# Patient Record
Sex: Female | Born: 1985 | Race: Black or African American | Hispanic: No | Marital: Single | Smoking: Current every day smoker
Health system: Southern US, Community
[De-identification: ages and names within clinical notes are randomized; demographics above are authoritative.]

## PROBLEM LIST (undated history)

## (undated) ENCOUNTER — Inpatient Hospital Stay: Payer: Self-pay

## (undated) DIAGNOSIS — F329 Major depressive disorder, single episode, unspecified: Secondary | ICD-10-CM

## (undated) DIAGNOSIS — F32A Depression, unspecified: Secondary | ICD-10-CM

## (undated) HISTORY — PX: NO PAST SURGERIES: SHX2092

---
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
## 2004-07-07 ENCOUNTER — Inpatient Hospital Stay: Payer: Self-pay

## 2004-08-25 ENCOUNTER — Emergency Department: Payer: Self-pay | Admitting: Unknown Physician Specialty

## 2005-07-29 ENCOUNTER — Emergency Department: Payer: Self-pay | Admitting: Emergency Medicine

## 2006-01-28 ENCOUNTER — Other Ambulatory Visit: Payer: Self-pay

## 2006-01-28 ENCOUNTER — Emergency Department: Payer: Self-pay | Admitting: Emergency Medicine

## 2006-07-12 ENCOUNTER — Emergency Department: Payer: Self-pay | Admitting: General Practice

## 2006-07-12 ENCOUNTER — Other Ambulatory Visit: Payer: Self-pay

## 2006-11-26 ENCOUNTER — Emergency Department: Payer: Self-pay | Admitting: Emergency Medicine

## 2007-07-16 ENCOUNTER — Emergency Department: Payer: Self-pay | Admitting: Emergency Medicine

## 2007-09-29 ENCOUNTER — Emergency Department: Payer: Self-pay | Admitting: Emergency Medicine

## 2008-04-01 ENCOUNTER — Emergency Department: Payer: Self-pay | Admitting: Emergency Medicine

## 2008-08-04 ENCOUNTER — Emergency Department: Payer: Self-pay | Admitting: Emergency Medicine

## 2008-11-07 ENCOUNTER — Emergency Department: Payer: Self-pay | Admitting: Emergency Medicine

## 2008-12-03 ENCOUNTER — Emergency Department: Payer: Self-pay | Admitting: Emergency Medicine

## 2008-12-12 ENCOUNTER — Encounter: Payer: Self-pay | Admitting: Maternal & Fetal Medicine

## 2009-01-02 ENCOUNTER — Encounter: Payer: Self-pay | Admitting: Obstetrics and Gynecology

## 2009-01-09 ENCOUNTER — Emergency Department: Payer: Self-pay | Admitting: Unknown Physician Specialty

## 2009-02-03 ENCOUNTER — Encounter: Payer: Self-pay | Admitting: Maternal & Fetal Medicine

## 2009-03-03 ENCOUNTER — Encounter: Payer: Self-pay | Admitting: Maternal and Fetal Medicine

## 2009-03-24 ENCOUNTER — Encounter: Payer: Self-pay | Admitting: Obstetrics and Gynecology

## 2009-03-24 ENCOUNTER — Observation Stay: Payer: Self-pay

## 2009-03-25 ENCOUNTER — Ambulatory Visit: Payer: Self-pay | Admitting: Unknown Physician Specialty

## 2009-04-07 ENCOUNTER — Encounter: Payer: Self-pay | Admitting: Obstetrics and Gynecology

## 2009-04-24 ENCOUNTER — Encounter: Payer: Self-pay | Admitting: Obstetrics and Gynecology

## 2009-05-19 ENCOUNTER — Encounter: Payer: Self-pay | Admitting: Maternal & Fetal Medicine

## 2009-05-28 ENCOUNTER — Encounter: Payer: Self-pay | Admitting: Maternal & Fetal Medicine

## 2009-06-07 ENCOUNTER — Observation Stay: Payer: Self-pay

## 2009-06-14 ENCOUNTER — Observation Stay: Payer: Self-pay | Admitting: Obstetrics & Gynecology

## 2009-06-14 ENCOUNTER — Observation Stay: Payer: Self-pay

## 2009-06-21 ENCOUNTER — Observation Stay: Payer: Self-pay

## 2009-06-23 ENCOUNTER — Inpatient Hospital Stay: Payer: Self-pay

## 2009-08-21 ENCOUNTER — Emergency Department: Payer: Self-pay | Admitting: Emergency Medicine

## 2009-08-31 ENCOUNTER — Emergency Department: Payer: Self-pay | Admitting: Emergency Medicine

## 2009-09-07 ENCOUNTER — Emergency Department: Payer: Self-pay | Admitting: Unknown Physician Specialty

## 2009-10-01 ENCOUNTER — Emergency Department: Payer: Self-pay | Admitting: Emergency Medicine

## 2010-03-12 ENCOUNTER — Emergency Department: Payer: Self-pay | Admitting: Emergency Medicine

## 2010-07-01 ENCOUNTER — Emergency Department: Payer: Self-pay | Admitting: Emergency Medicine

## 2011-05-13 ENCOUNTER — Emergency Department: Payer: Self-pay | Admitting: Emergency Medicine

## 2011-09-10 ENCOUNTER — Emergency Department: Payer: Self-pay | Admitting: Unknown Physician Specialty

## 2011-10-13 ENCOUNTER — Emergency Department: Payer: Self-pay

## 2011-11-28 ENCOUNTER — Emergency Department: Payer: Self-pay | Admitting: Emergency Medicine

## 2011-11-28 LAB — COMPREHENSIVE METABOLIC PANEL
Albumin: 3.9 g/dL (ref 3.4–5.0)
Anion Gap: 11 (ref 7–16)
BUN: 20 mg/dL — ABNORMAL HIGH (ref 7–18)
Calcium, Total: 8.8 mg/dL (ref 8.5–10.1)
Chloride: 105 mmol/L (ref 98–107)
Co2: 27 mmol/L (ref 21–32)
EGFR (African American): >60
EGFR (Non-African Amer.): >60
Potassium: 3.9 mmol/L (ref 3.5–5.1)
SGOT(AST): 18 U/L (ref 15–37)
SGPT (ALT): 23 U/L

## 2011-11-28 LAB — URINALYSIS, COMPLETE
Ketone: NEGATIVE
Leukocyte Esterase: NEGATIVE
Nitrite: NEGATIVE
Ph: 7 (ref 4.5–8.0)
Protein: NEGATIVE
RBC,UR: 1 /HPF (ref 0–5)
Specific Gravity: 1.021 (ref 1.003–1.030)
WBC UR: 1 /HPF (ref 0–5)

## 2011-11-28 LAB — CBC
HCT: 40 % (ref 35.0–47.0)
HGB: 13.3 g/dL (ref 12.0–16.0)
MCH: 32.4 pg (ref 26.0–34.0)
MCV: 98 fL (ref 80–100)
Platelet: 310 10*3/uL (ref 150–440)
RBC: 4.1 10*6/uL (ref 3.80–5.20)
RDW: 13 % (ref 11.5–14.5)

## 2011-11-28 LAB — TROPONIN I: Troponin-I: <0.02 ng/mL

## 2011-12-16 ENCOUNTER — Emergency Department: Payer: Self-pay | Admitting: Emergency Medicine

## 2011-12-23 ENCOUNTER — Emergency Department: Payer: Self-pay | Admitting: Emergency Medicine

## 2011-12-23 LAB — CBC WITH DIFFERENTIAL/PLATELET
Basophil #: 0 10*3/uL (ref 0.0–0.1)
Basophil %: 0.4 %
HGB: 14.9 g/dL (ref 12.0–16.0)
MCH: 32.7 pg (ref 26.0–34.0)
MCHC: 33.7 g/dL (ref 32.0–36.0)
Monocyte #: 0.3 10*3/uL (ref 0.0–0.7)
Monocyte %: 7.4 %
Neutrophil #: 2.1 10*3/uL (ref 1.4–6.5)
Platelet: 311 10*3/uL (ref 150–440)
RBC: 4.56 10*6/uL (ref 3.80–5.20)
WBC: 4.5 10*3/uL (ref 3.6–11.0)

## 2011-12-23 LAB — COMPREHENSIVE METABOLIC PANEL
Albumin: 4.3 g/dL (ref 3.4–5.0)
Alkaline Phosphatase: 74 U/L (ref 50–136)
BUN: 12 mg/dL (ref 7–18)
Bilirubin,Total: 0.7 mg/dL (ref 0.2–1.0)
Calcium, Total: 8.7 mg/dL (ref 8.5–10.1)
Chloride: 108 mmol/L — ABNORMAL HIGH (ref 98–107)
Creatinine: 0.28 mg/dL — ABNORMAL LOW (ref 0.60–1.30)
EGFR (Non-African Amer.): >60
Glucose: 73 mg/dL (ref 65–99)
Osmolality: 278 (ref 275–301)
Potassium: 5.4 mmol/L — ABNORMAL HIGH (ref 3.5–5.1)
SGOT(AST): 53 U/L — ABNORMAL HIGH (ref 15–37)
SGPT (ALT): 25 U/L
Sodium: 140 mmol/L (ref 136–145)
Total Protein: 8.7 g/dL — ABNORMAL HIGH (ref 6.4–8.2)

## 2012-01-04 ENCOUNTER — Emergency Department: Payer: Self-pay | Admitting: *Deleted

## 2012-01-04 LAB — URINALYSIS, COMPLETE
Bacteria: NONE SEEN
Ketone: NEGATIVE
Nitrite: NEGATIVE
Ph: 7 (ref 4.5–8.0)
RBC,UR: 1 /HPF (ref 0–5)
Specific Gravity: 1.014 (ref 1.003–1.030)
Squamous Epithelial: 4

## 2012-02-03 ENCOUNTER — Emergency Department: Payer: Self-pay | Admitting: *Deleted

## 2012-02-03 LAB — URINALYSIS, COMPLETE
Bilirubin,UR: NEGATIVE
Blood: NEGATIVE
Glucose,UR: NEGATIVE mg/dL (ref 0–75)
Leukocyte Esterase: NEGATIVE
Ph: 6 (ref 4.5–8.0)
RBC,UR: <1 /HPF (ref 0–5)
Specific Gravity: 1.023 (ref 1.003–1.030)
Squamous Epithelial: 7

## 2012-02-03 LAB — HCG, QUANTITATIVE, PREGNANCY: Beta Hcg, Quant.: 142112 m[IU]/mL — ABNORMAL HIGH

## 2012-02-10 ENCOUNTER — Encounter: Payer: Self-pay | Admitting: Obstetrics and Gynecology

## 2012-03-02 ENCOUNTER — Encounter: Payer: Self-pay | Admitting: Obstetrics & Gynecology

## 2012-03-09 ENCOUNTER — Emergency Department: Payer: Self-pay | Admitting: Unknown Physician Specialty

## 2012-03-12 LAB — BETA STREP CULTURE(ARMC)

## 2012-03-20 ENCOUNTER — Emergency Department: Payer: Self-pay | Admitting: *Deleted

## 2012-03-21 LAB — URINALYSIS, COMPLETE
Glucose,UR: NEGATIVE mg/dL (ref 0–75)
Leukocyte Esterase: NEGATIVE
Nitrite: NEGATIVE
Protein: NEGATIVE
RBC,UR: 4 /HPF (ref 0–5)
Squamous Epithelial: 22
WBC UR: 11 /HPF (ref 0–5)

## 2012-03-23 ENCOUNTER — Encounter: Payer: Self-pay | Admitting: Obstetrics and Gynecology

## 2012-04-06 ENCOUNTER — Encounter: Payer: Self-pay | Admitting: Maternal and Fetal Medicine

## 2012-04-06 ENCOUNTER — Emergency Department: Payer: Self-pay | Admitting: Emergency Medicine

## 2012-04-06 LAB — URINALYSIS, COMPLETE
Blood: NEGATIVE
Glucose,UR: NEGATIVE mg/dL (ref 0–75)
Ketone: NEGATIVE
Nitrite: NEGATIVE
Protein: NEGATIVE
Specific Gravity: 1.02 (ref 1.003–1.030)
WBC UR: 8 /HPF (ref 0–5)

## 2012-04-18 ENCOUNTER — Emergency Department: Payer: Self-pay | Admitting: *Deleted

## 2012-04-20 ENCOUNTER — Encounter: Payer: Self-pay | Admitting: Obstetrics & Gynecology

## 2012-05-02 ENCOUNTER — Observation Stay: Payer: Self-pay | Admitting: Obstetrics and Gynecology

## 2012-05-02 LAB — URINALYSIS, COMPLETE
Bilirubin,UR: NEGATIVE
Nitrite: NEGATIVE
Protein: NEGATIVE
RBC,UR: 1 /HPF (ref 0–5)
Specific Gravity: 1.012 (ref 1.003–1.030)
WBC UR: 4 /HPF (ref 0–5)

## 2012-05-02 LAB — DRUG SCREEN, URINE
Barbiturates, Ur Screen: NEGATIVE (ref ?–200)
Benzodiazepine, Ur Scrn: NEGATIVE (ref ?–200)
Cannabinoid 50 Ng, Ur ~~LOC~~: NEGATIVE (ref ?–50)
MDMA (Ecstasy)Ur Screen: NEGATIVE (ref ?–500)
Methadone, Ur Screen: NEGATIVE (ref ?–300)
Phencyclidine (PCP) Ur S: NEGATIVE (ref ?–25)

## 2012-05-04 ENCOUNTER — Encounter: Payer: Self-pay | Admitting: Obstetrics and Gynecology

## 2012-05-15 ENCOUNTER — Emergency Department: Payer: Self-pay | Admitting: Emergency Medicine

## 2012-05-15 LAB — COMPREHENSIVE METABOLIC PANEL
Anion Gap: 7 (ref 7–16)
Bilirubin,Total: 0.3 mg/dL (ref 0.2–1.0)
Calcium, Total: 8.7 mg/dL (ref 8.5–10.1)
Co2: 26 mmol/L (ref 21–32)
Creatinine: 0.53 mg/dL — ABNORMAL LOW (ref 0.60–1.30)
EGFR (African American): >60
EGFR (Non-African Amer.): >60
Glucose: 70 mg/dL (ref 65–99)
Osmolality: 270 (ref 275–301)
Potassium: 3.4 mmol/L — ABNORMAL LOW (ref 3.5–5.1)

## 2012-05-15 LAB — CBC
HCT: 33 % — ABNORMAL LOW (ref 35.0–47.0)
HGB: 11.7 g/dL — ABNORMAL LOW (ref 12.0–16.0)
MCH: 33.9 pg (ref 26.0–34.0)
MCHC: 35.4 g/dL (ref 32.0–36.0)
Platelet: 308 10*3/uL (ref 150–440)
RDW: 14 % (ref 11.5–14.5)
WBC: 7.8 10*3/uL (ref 3.6–11.0)

## 2012-05-15 LAB — URINALYSIS, COMPLETE
Bilirubin,UR: NEGATIVE
Blood: NEGATIVE
Ketone: NEGATIVE
Leukocyte Esterase: NEGATIVE
Ph: 7 (ref 4.5–8.0)
RBC,UR: NONE SEEN /HPF (ref 0–5)
Specific Gravity: 1.015 (ref 1.003–1.030)

## 2012-05-16 ENCOUNTER — Observation Stay: Payer: Self-pay

## 2012-05-16 LAB — RAPID INFLUENZA A&B ANTIGENS

## 2012-05-18 ENCOUNTER — Encounter: Payer: Self-pay | Admitting: Maternal & Fetal Medicine

## 2012-06-01 ENCOUNTER — Encounter: Payer: Self-pay | Admitting: Obstetrics and Gynecology

## 2012-06-03 ENCOUNTER — Observation Stay: Payer: Self-pay | Admitting: Obstetrics & Gynecology

## 2012-06-03 LAB — URINALYSIS, COMPLETE
Bilirubin,UR: NEGATIVE
Blood: NEGATIVE
Nitrite: NEGATIVE
Ph: 7 (ref 4.5–8.0)
RBC,UR: 1 /HPF (ref 0–5)
Squamous Epithelial: 11

## 2012-06-03 LAB — WET PREP, GENITAL

## 2012-06-03 LAB — FETAL FIBRONECTIN: Fetal Fibronectin: NEGATIVE

## 2012-06-16 ENCOUNTER — Observation Stay: Payer: Self-pay | Admitting: Obstetrics and Gynecology

## 2012-06-16 LAB — URINALYSIS, COMPLETE
Bilirubin,UR: NEGATIVE
Blood: NEGATIVE
Nitrite: NEGATIVE
Ph: 7 (ref 4.5–8.0)
Protein: NEGATIVE
Specific Gravity: 1.006 (ref 1.003–1.030)
Squamous Epithelial: 49
WBC UR: 7 /HPF (ref 0–5)

## 2012-06-17 LAB — URINE CULTURE

## 2012-06-22 ENCOUNTER — Observation Stay: Payer: Self-pay | Admitting: Obstetrics and Gynecology

## 2012-06-22 LAB — URINALYSIS, COMPLETE
Bacteria: NONE SEEN
Bilirubin,UR: NEGATIVE
Blood: NEGATIVE
Glucose,UR: NEGATIVE mg/dL (ref 0–75)
Ketone: NEGATIVE
Nitrite: NEGATIVE
RBC,UR: <1 /HPF (ref 0–5)
Specific Gravity: 1.01 (ref 1.003–1.030)
Squamous Epithelial: 10
WBC UR: 1 /HPF (ref 0–5)

## 2012-07-07 ENCOUNTER — Observation Stay: Payer: Self-pay | Admitting: Obstetrics & Gynecology

## 2012-07-07 LAB — URINALYSIS, COMPLETE
Bilirubin,UR: NEGATIVE
Blood: NEGATIVE
Ketone: NEGATIVE
Protein: NEGATIVE
RBC,UR: <1 /HPF (ref 0–5)
Specific Gravity: 1.006 (ref 1.003–1.030)
WBC UR: 1 /HPF (ref 0–5)

## 2012-07-20 ENCOUNTER — Observation Stay: Payer: Self-pay | Admitting: Obstetrics and Gynecology

## 2012-07-20 LAB — CBC WITH DIFFERENTIAL/PLATELET
Eosinophil #: 0.2 10*3/uL (ref 0.0–0.7)
Eosinophil %: 2.4 %
HCT: 32.5 % — ABNORMAL LOW (ref 35.0–47.0)
Lymphocyte #: 2.1 10*3/uL (ref 1.0–3.6)
Lymphocyte %: 25.8 %
MCV: 96 fL (ref 80–100)
Monocyte #: 1 x10 3/mm — ABNORMAL HIGH (ref 0.2–0.9)
Monocyte %: 12.4 %
Neutrophil %: 59.1 %
Platelet: 315 10*3/uL (ref 150–440)
RBC: 3.39 10*6/uL — ABNORMAL LOW (ref 3.80–5.20)
RDW: 13.4 % (ref 11.5–14.5)
WBC: 8.2 10*3/uL (ref 3.6–11.0)

## 2012-07-25 ENCOUNTER — Observation Stay: Payer: Self-pay | Admitting: Obstetrics and Gynecology

## 2012-07-26 ENCOUNTER — Observation Stay: Payer: Self-pay | Admitting: Obstetrics and Gynecology

## 2012-07-26 LAB — CBC WITH DIFFERENTIAL/PLATELET
Basophil %: 1 %
Eosinophil %: 2.4 %
HCT: 31.8 % — ABNORMAL LOW (ref 35.0–47.0)
Lymphocyte #: 2 10*3/uL (ref 1.0–3.6)
Lymphocyte %: 28.4 %
MCH: 32.5 pg (ref 26.0–34.0)
Neutrophil #: 3.9 10*3/uL (ref 1.4–6.5)
Neutrophil %: 57 %
Platelet: 275 10*3/uL (ref 150–440)
RBC: 3.34 10*6/uL — ABNORMAL LOW (ref 3.80–5.20)
WBC: 6.9 10*3/uL (ref 3.6–11.0)

## 2012-07-26 LAB — DRUG SCREEN, URINE
Cannabinoid 50 Ng, Ur ~~LOC~~: POSITIVE (ref ?–50)
Cocaine Metabolite,Ur ~~LOC~~: NEGATIVE (ref ?–300)
MDMA (Ecstasy)Ur Screen: NEGATIVE (ref ?–500)
Methadone, Ur Screen: NEGATIVE (ref ?–300)
Phencyclidine (PCP) Ur S: NEGATIVE (ref ?–25)
Tricyclic, Ur Screen: NEGATIVE (ref ?–1000)

## 2012-07-26 LAB — URINALYSIS, COMPLETE
Bilirubin,UR: NEGATIVE
Glucose,UR: NEGATIVE mg/dL (ref 0–75)
Nitrite: NEGATIVE
RBC,UR: 1 /HPF (ref 0–5)
Squamous Epithelial: 13
WBC UR: 5 /HPF (ref 0–5)

## 2012-07-31 ENCOUNTER — Observation Stay: Payer: Self-pay

## 2012-08-13 ENCOUNTER — Inpatient Hospital Stay: Payer: Self-pay

## 2012-08-13 LAB — URINALYSIS, COMPLETE
Bilirubin,UR: NEGATIVE
Ketone: NEGATIVE
Leukocyte Esterase: NEGATIVE
Nitrite: NEGATIVE
Ph: 6 (ref 4.5–8.0)
Protein: 30
RBC,UR: 384 /HPF (ref 0–5)
Specific Gravity: 1.02 (ref 1.003–1.030)

## 2012-08-13 LAB — CBC WITH DIFFERENTIAL/PLATELET
Basophil #: 0 10*3/uL (ref 0.0–0.1)
Basophil %: 0.5 %
Eosinophil #: 0.2 10*3/uL (ref 0.0–0.7)
Eosinophil %: 2.3 %
Lymphocyte #: 2.4 10*3/uL (ref 1.0–3.6)
MCH: 34 pg (ref 26.0–34.0)
MCHC: 35.5 g/dL (ref 32.0–36.0)
MCV: 96 fL (ref 80–100)
Monocyte #: 0.8 x10 3/mm (ref 0.2–0.9)
Neutrophil %: 55.5 %
Platelet: 324 10*3/uL (ref 150–440)
RDW: 13.5 % (ref 11.5–14.5)

## 2013-02-21 ENCOUNTER — Emergency Department: Payer: Self-pay | Admitting: Emergency Medicine

## 2013-02-21 LAB — CBC
HCT: 43.5 % (ref 35.0–47.0)
MCH: 31.8 pg (ref 26.0–34.0)
Platelet: 403 10*3/uL (ref 150–440)
RBC: 4.69 10*6/uL (ref 3.80–5.20)
RDW: 12.8 % (ref 11.5–14.5)
WBC: 5.7 10*3/uL (ref 3.6–11.0)

## 2013-02-22 LAB — COMPREHENSIVE METABOLIC PANEL
Alkaline Phosphatase: 108 U/L (ref 50–136)
Bilirubin,Total: 0.5 mg/dL (ref 0.2–1.0)
Calcium, Total: 9.3 mg/dL (ref 8.5–10.1)
Chloride: 104 mmol/L (ref 98–107)
EGFR (Non-African Amer.): >60
Osmolality: 271 (ref 275–301)
Potassium: 3.5 mmol/L (ref 3.5–5.1)
SGOT(AST): 21 U/L (ref 15–37)
SGPT (ALT): 21 U/L (ref 12–78)
Sodium: 136 mmol/L (ref 136–145)

## 2013-02-22 LAB — URINALYSIS, COMPLETE
Bilirubin,UR: NEGATIVE
Leukocyte Esterase: NEGATIVE
Ph: 6 (ref 4.5–8.0)
Protein: 100
Specific Gravity: 1.028 (ref 1.003–1.030)
Squamous Epithelial: 3

## 2013-02-22 LAB — PREGNANCY, URINE: Pregnancy Test, Urine: NEGATIVE m[IU]/mL

## 2013-02-24 ENCOUNTER — Emergency Department: Payer: Self-pay | Admitting: Emergency Medicine
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
## 2014-02-07 ENCOUNTER — Emergency Department: Payer: Self-pay | Admitting: Emergency Medicine

## 2014-02-07 LAB — BASIC METABOLIC PANEL
Anion Gap: 6 — ABNORMAL LOW (ref 7–16)
BUN: 15 mg/dL (ref 7–18)
CREATININE: 0.66 mg/dL (ref 0.60–1.30)
Calcium, Total: 9 mg/dL (ref 8.5–10.1)
Chloride: 104 mmol/L (ref 98–107)
Co2: 28 mmol/L (ref 21–32)
EGFR (African American): >60
EGFR (Non-African Amer.): >60
GLUCOSE: 111 mg/dL — AB (ref 65–99)
Osmolality: 277 (ref 275–301)
Potassium: 3.5 mmol/L (ref 3.5–5.1)
Sodium: 138 mmol/L (ref 136–145)

## 2014-02-07 LAB — CBC
HCT: 42.3 % (ref 35.0–47.0)
HGB: 14.3 g/dL (ref 12.0–16.0)
MCH: 32.1 pg (ref 26.0–34.0)
MCHC: 33.8 g/dL (ref 32.0–36.0)
MCV: 95 fL (ref 80–100)
Platelet: 277 10*3/uL (ref 150–440)
RBC: 4.45 10*6/uL (ref 3.80–5.20)
RDW: 12.8 % (ref 11.5–14.5)
WBC: 4.3 10*3/uL (ref 3.6–11.0)

## 2014-02-07 LAB — TROPONIN I

## 2014-02-25 IMAGING — US ULTRAOUND OB LIMITED - NRPT [facility]
1 series · 14 of 25 positions shown · non-contrast
Comparison: none

[Series 1: ultraound ob limited - nrpt [facility] · 0.26mm/px · 14 of 25 slices shown]
[im 1/25]
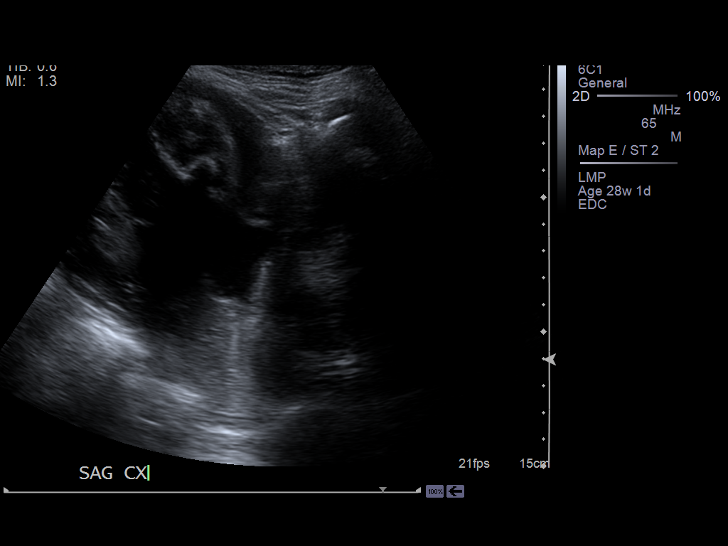
[im 3/25]
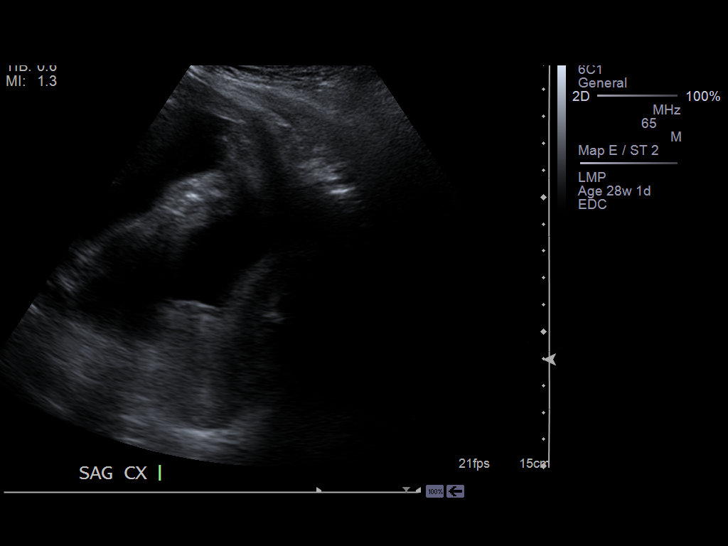
[im 5/25]
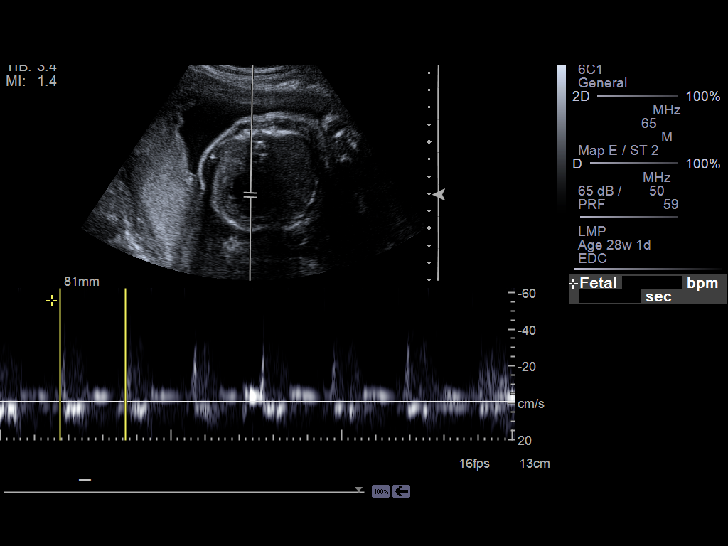
[im 7/25]
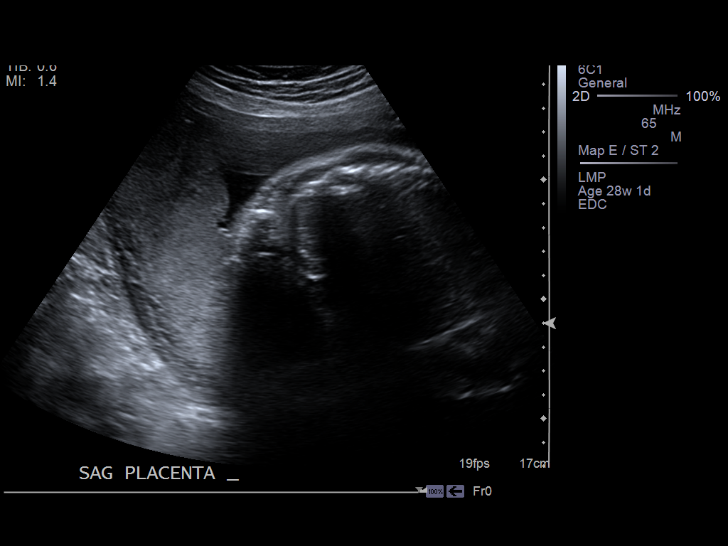
[im 9/25]
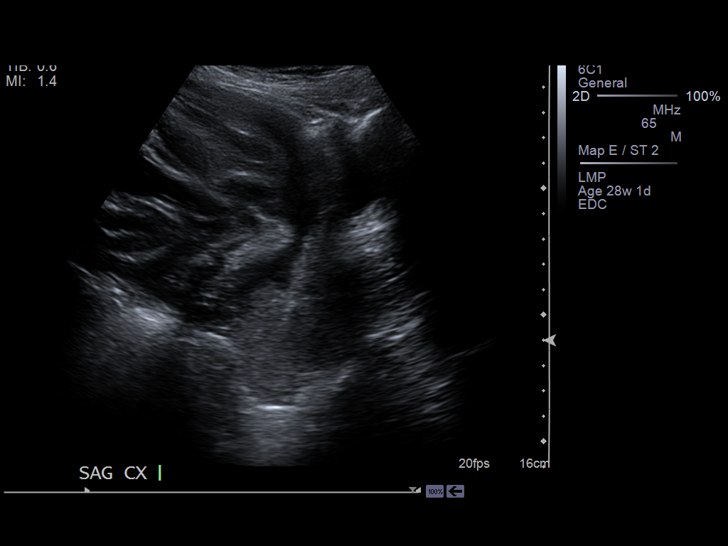
[im 10/25]
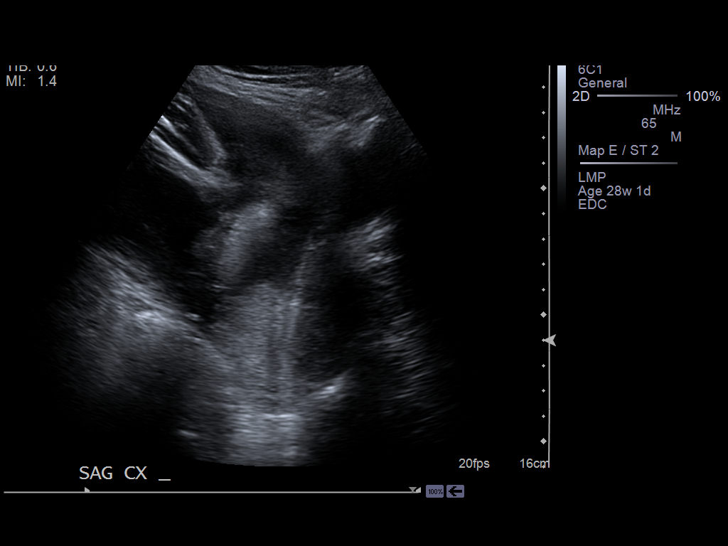
[im 12/25]
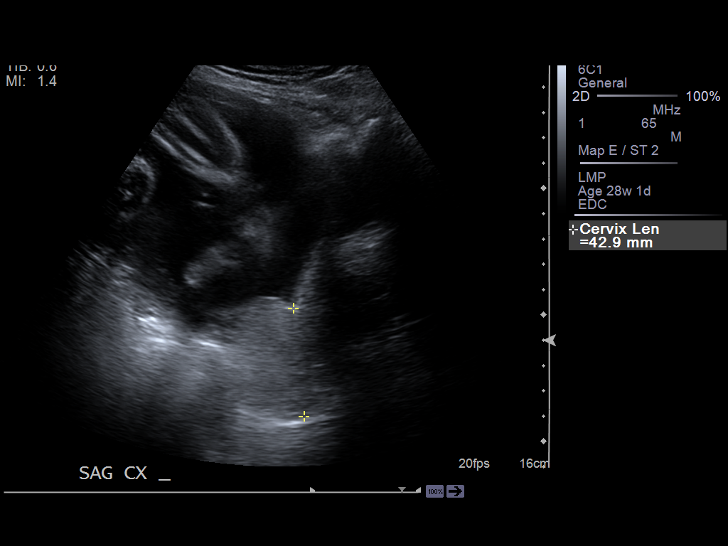
[im 14/25]
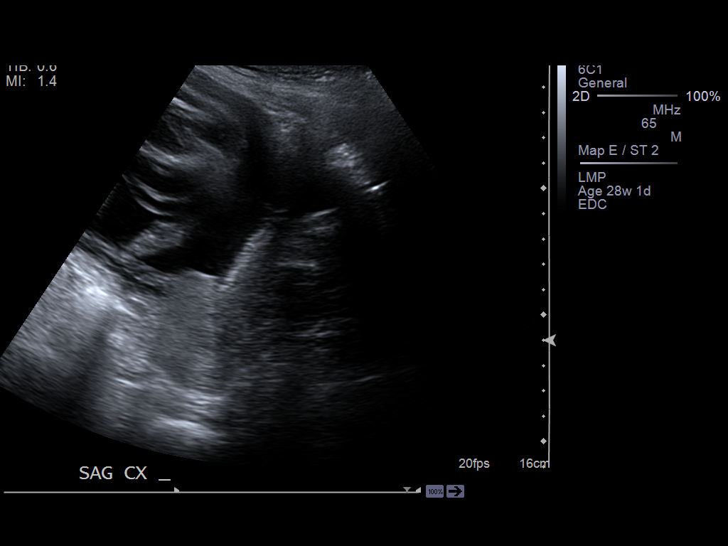
[im 16/25]
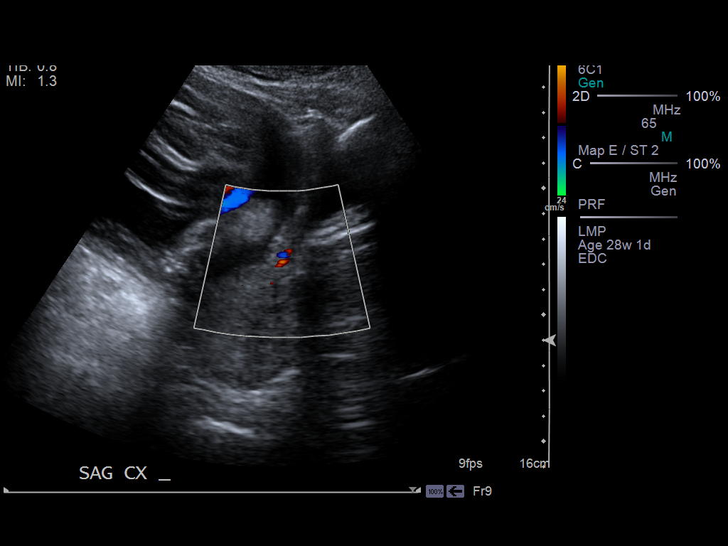
[im 17/25]
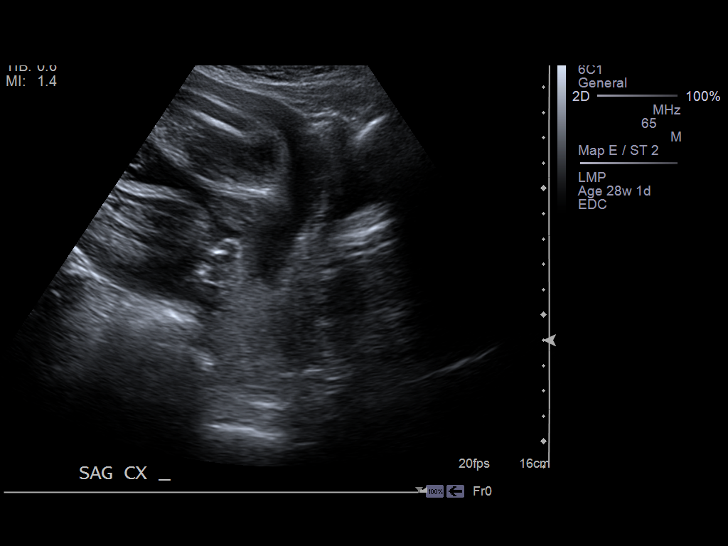
[im 19/25]
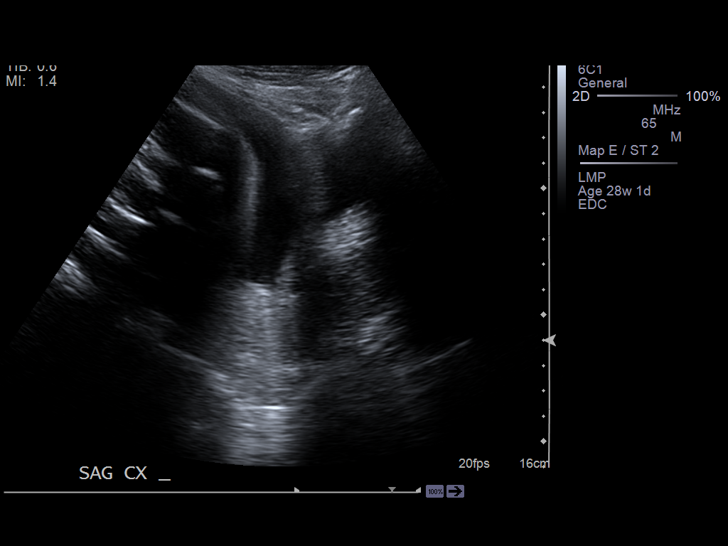
[im 21/25]
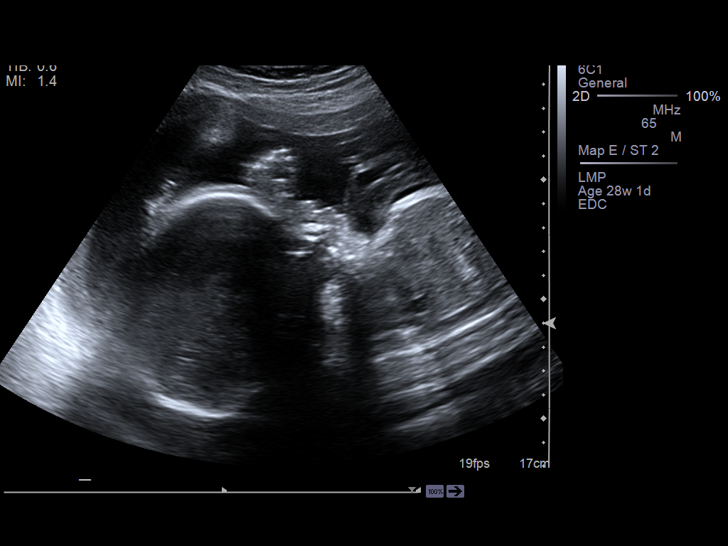
[im 23/25]
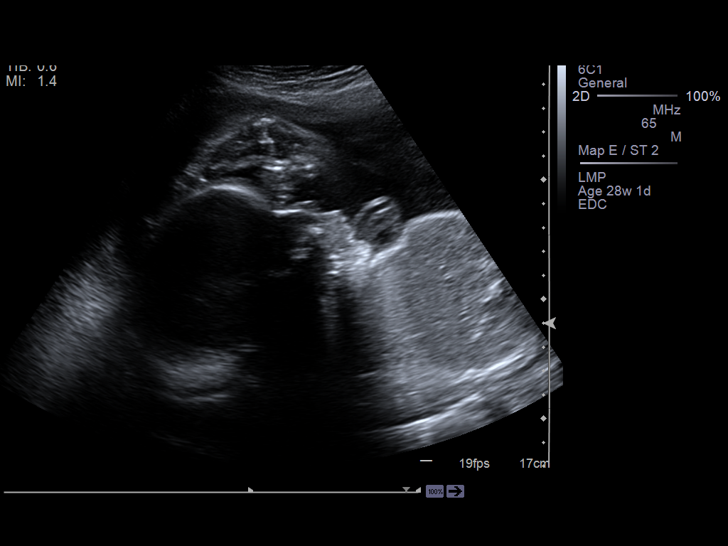
[im 25/25]
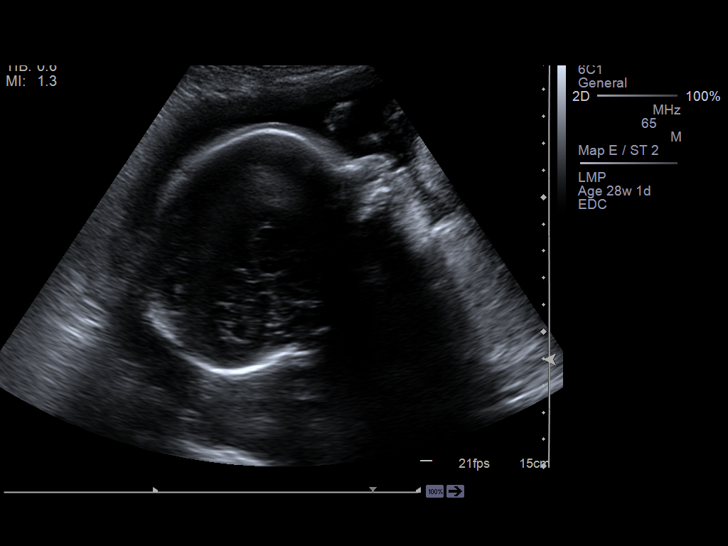

[14 of 25 positions shown; findings below may reference images not displayed]

IMAGES IMPORTED FROM THE [employer] WORKFLOW SYSTEM
NO DICTATION FOR STUDY

## 2014-05-22 ENCOUNTER — Emergency Department: Payer: Self-pay | Admitting: Emergency Medicine

## 2014-06-09 ENCOUNTER — Emergency Department: Payer: Self-pay | Admitting: Emergency Medicine

## 2014-07-20 ENCOUNTER — Emergency Department: Payer: Self-pay | Admitting: Emergency Medicine

## 2014-09-12 ENCOUNTER — Emergency Department: Payer: Self-pay | Admitting: Emergency Medicine
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
## 2015-02-04 NOTE — H&P (Signed)
L&D Evaluation:  History:   HPI 29yo [id number] at 6473w4d by 6wk U/s presents with c/o contractions, increasing in intensity and frequency.    PNC at [facility], history notable for preterm delivery at 24wks, followed by term delivery at 38wks.  Also breech position and s/p successful ECV 2 weeks ago.  PNL - B+, RI, HepB neg, HIV neg, GC/chlam neg, pap neg, 1hr GTT 123, VI, GBS neg.    Presents with contractions    Patient's Medical History Depression, polysubstance abuse    Patient's Surgical History none    Medications trazodone, celexa    Allergies NKDA    Social History previous marijuana and EtOH    Family History Non-Contributory   ROS:   ROS All systems were reviewed.  HEENT, CNS, GI, GU, Respiratory, CV, Renal and Musculoskeletal systems were found to be normal.   Exam:   Vital Signs stable    General no apparent distress    Mental Status clear    Chest normal effort    Abdomen gravid, non-tender    Estimated Fetal Weight Average for gestational age, EFW 5#    Edema no edema    Pelvic 8 / 75 / -2 with bulging bag    Mebranes Intact    FHT normal rate with no decels    Ucx regular, q2 min    Skin dry   Impression:   Impression active labor   Plan:   Plan EFM/NST, monitor contractions and for cervical change   Electronic Signatures: Garnette GunnerStansbury Clipp, Ali LoweEryn K (MD)  (Signed [phone]-Nov-13 04:07)  Authored: L&D Evaluation   Last Updated: 17-Nov-13 04:07 by Garnette GunnerStansbury Clipp, Ali LoweEryn K (MD)
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
## 2015-02-04 NOTE — H&P (Signed)
L&D Evaluation:  History:   HPI 29yo [id number] @ 4449w3d gestational age by 7 week ultrasound.  Pregnancy complicated by history of preterm delivery at 24 weeks after PPROM.  She had a subsequent pregnancy that went to term on 17-OHPC, which she is using this pregnancy, too.  She also has a history of marijuana and alcohol use, as well as a history of depression.  She presents with lower abdominal and upper thigh cramping. She notes positive fetal movement, denies vaginal bleeding and leakage of fluid.  She denies urinary and GI symptoms.  She also has no vaginal symptoms.    Patient's Medical History No Chronic Illness    Patient's Surgical History none    Medications Pre Natal Vitamins    Allergies NKDA    Social History EtOH  marijuana    Family History Non-Contributory   ROS:   ROS All systems were reviewed.  HEENT, CNS, GI, GU, Respiratory, CV, Renal and Musculoskeletal systems were found to be normal., unless noted in HPI   Exam:   Vital Signs stable    Urine Protein negative dipstick    General no apparent distress    Mental Status clear    Chest clear    Heart normal sinus rhythm    Abdomen gravid, non-tender    Estimated Fetal Weight Average for gestational age    Back no CVAT    Edema no edema    Pelvic no external lesions, multiparous, fingertip    Mebranes Intact    FHT normal rate with no decels    FHT Description 140/mod var/+10x10 accels/no decels    Ucx absent    Skin no lesions    Other UA: 1+LE, epithelial cells=11/hpf, bacteria=none seen, nitrites=neg fFN = NEG [place]: +TRICH, neg clue cells, neg fungal elements   Impression:   Impression abdominal cramping   Plan:   Plan UA, EFM/NST    Comments -Trichomonas.  Will treat with Flagyl 2,000mg  x 1.  Emphasized need for treatment of partner. -UA negative for infection fFN negative, discussed implications of this test with patient. -keep routine follow up or return to L&D, if problems  arise.    Follow Up Appointment already scheduled   Electronic Signatures: Conard NovakJackson, Talley Kreiser D (MD)  (Signed 07-Sep-13 15:25)  Authored: L&D Evaluation   Last Updated: 07-Sep-13 15:25 by Conard NovakJackson, Erricka Falkner D (MD)
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
## 2015-02-16 ENCOUNTER — Encounter: Payer: Self-pay | Admitting: Emergency Medicine

## 2015-02-16 ENCOUNTER — Emergency Department
Admission: EM | Admit: 2015-02-16 | Discharge: 2015-02-16 | Disposition: A | Discharge status: Home / Self Care | Payer: Medicaid Other | Attending: Emergency Medicine | Admitting: Emergency Medicine

## 2015-02-16 DIAGNOSIS — Y9389 Activity, other specified: Secondary | ICD-10-CM | POA: Diagnosis not present

## 2015-02-16 DIAGNOSIS — Y998 Other external cause status: Secondary | ICD-10-CM | POA: Insufficient documentation

## 2015-02-16 DIAGNOSIS — Y9289 Other specified places as the place of occurrence of the external cause: Secondary | ICD-10-CM | POA: Diagnosis not present

## 2015-02-16 DIAGNOSIS — Z720 Tobacco use: Secondary | ICD-10-CM | POA: Insufficient documentation

## 2015-02-16 DIAGNOSIS — S0093XA Contusion of unspecified part of head, initial encounter: Secondary | ICD-10-CM | POA: Insufficient documentation

## 2015-02-16 DIAGNOSIS — S0990XA Unspecified injury of head, initial encounter: Secondary | ICD-10-CM | POA: Diagnosis present

## 2015-02-16 HISTORY — DX: Depression, unspecified: F32.A

## 2015-02-16 HISTORY — DX: Major depressive disorder, single episode, unspecified: F32.9

## 2015-02-16 MED ORDER — KETOROLAC TROMETHAMINE 60 MG/2ML IM SOLN
INTRAMUSCULAR | Status: AC
  Administered 2015-02-16: 60 mg
  Filled 2015-02-16: qty 2

## 2015-02-16 MED ORDER — CYCLOBENZAPRINE HCL 5 MG PO TABS: 5.0000 mg | ORAL_TABLET | Freq: Three times a day (TID) | ORAL | Status: DC | PRN

## 2015-02-16 MED ORDER — KETOROLAC TROMETHAMINE 30 MG/ML IJ SOLN: 60.0000 mg | Freq: Once | INTRAMUSCULAR | Status: AC

## 2015-02-16 MED ORDER — NAPROXEN SODIUM 550 MG PO TABS: 550.0000 mg | ORAL_TABLET | Freq: Two times a day (BID) | ORAL | Status: DC
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
## 2015-02-16 NOTE — Discharge Instructions (Signed)
Head Injury  You have received a head injury. It does not appear serious at this time. Headaches and vomiting are common following head injury. It should be easy to awaken from sleeping. Sometimes it is necessary for you to stay in the emergency department for a while for observation. Sometimes admission to the hospital may be needed. After injuries such as yours, most problems occur within the first 24 hours, but side effects may occur up to 7-10 days after the injury. It is important for you to carefully monitor your condition and contact your health care provider or seek immediate medical care if there is a change in your condition.  WHAT ARE THE TYPES OF HEAD INJURIES?  Head injuries can be as minor as a bump. Some head injuries can be more severe. More severe head injuries include:   A jarring injury to the brain (concussion).   A bruise of the brain (contusion). This mean there is bleeding in the brain that can cause swelling.   A cracked skull (skull fracture).   Bleeding in the brain that collects, clots, and forms a bump (hematoma).  WHAT CAUSES A HEAD INJURY?  A serious head injury is most likely to happen to someone who is in a car wreck and is not wearing a seat belt. Other causes of major head injuries include bicycle or motorcycle accidents, sports injuries, and falls.  HOW ARE HEAD INJURIES DIAGNOSED?  A complete history of the event leading to the injury and your current symptoms will be helpful in diagnosing head injuries. Many times, pictures of the brain, such as CT or MRI are needed to see the extent of the injury. Often, an overnight hospital stay is necessary for observation.   WHEN SHOULD I SEEK IMMEDIATE MEDICAL CARE?   You should get help right away if:   You have confusion or drowsiness.   You feel sick to your stomach (nauseous) or have continued, forceful vomiting.   You have dizziness or unsteadiness that is getting worse.   You have severe, continued headaches not relieved by  medicine. Only take over-the-counter or prescription medicines for pain, fever, or discomfort as directed by your health care provider.   You do not have normal function of the arms or legs or are unable to walk.   You notice changes in the black spots in the center of the colored part of your eye (pupil).   You have a clear or bloody fluid coming from your nose or ears.   You have a loss of vision.  During the next 24 hours after the injury, you must stay with someone who can watch you for the warning signs. This person should contact local emergency services (911 in the [place] if you have seizures, you become unconscious, or you are unable to wake up.  HOW CAN I PREVENT A HEAD INJURY IN THE FUTURE?  The most important factor for preventing major head injuries is avoiding motor vehicle accidents. To minimize the potential for damage to your head, it is crucial to wear seat belts while riding in motor vehicles. Wearing helmets while bike riding and playing collision sports (like football) is also helpful. Also, avoiding dangerous activities around the house will further help reduce your risk of head injury.   WHEN CAN I RETURN TO NORMAL ACTIVITIES AND ATHLETICS?  You should be reevaluated by your health care provider before returning to these activities. If you have any of the following symptoms, you should not return to activities   or contact sports until 1 week after the symptoms have stopped:   Persistent headache.   Dizziness or vertigo.   Poor attention and concentration.   Confusion.   Memory problems.   Nausea or vomiting.   Fatigue or tire easily.   Irritability.   Intolerant of bright lights or loud noises.   Anxiety or depression.   Disturbed sleep.  MAKE SURE YOU:    Understand these instructions.   Will watch your condition.   Will get help right away if you are not doing well or get worse.  Document Released: 09/13/2005 Document Revised: 09/18/2013 Document Reviewed:  05/21/2013  [employer] Patient Information 2015 [employer]. This information is not intended to replace advice given to you by your health care provider. Make sure you discuss any questions you have with your health care provider.          Facial or Scalp Contusion  A facial or scalp contusion is a deep bruise on the face or head. Injuries to the face and head generally cause a lot of swelling, especially around the eyes. Contusions are the result of an injury that caused bleeding under the skin. The contusion may turn blue, purple, or yellow. Minor injuries will give you a painless contusion, but more severe contusions may stay painful and swollen for a few weeks.   CAUSES   A facial or scalp contusion is caused by a blunt injury or trauma to the face or head area.   SIGNS AND SYMPTOMS    Swelling of the injured area.    Discoloration of the injured area.    Tenderness, soreness, or pain in the injured area.   DIAGNOSIS   The diagnosis can be made by taking a medical history and doing a physical exam. An X-ray exam, CT scan, or MRI may be needed to determine if there are any associated injuries, such as broken bones (fractures).  TREATMENT   Often, the best treatment for a facial or scalp contusion is applying cold compresses to the injured area. Over-the-counter medicines may also be recommended for pain control.   HOME CARE INSTRUCTIONS    Only take over-the-counter or prescription medicines as directed by your health care provider.    Apply ice to the injured area.    Put ice in a plastic bag.    Place a towel between your skin and the bag.    Leave the ice on for 20 minutes, 2-3 times a day.   SEEK MEDICAL CARE IF:   You have bite problems.    You have pain with chewing.    You are concerned about facial defects.  SEEK IMMEDIATE MEDICAL CARE IF:   You have severe pain or a headache that is not relieved by medicine.    You have unusual sleepiness, confusion, or personality changes.     You throw up (vomit).    You have a persistent nosebleed.    You have double vision or blurred vision.    You have fluid drainage from your nose or ear.    You have difficulty walking or using your arms or legs.   MAKE SURE YOU:    Understand these instructions.   Will watch your condition.   Will get help right away if you are not doing well or get worse.  Document Released: 10/21/2004 Document Revised: 07/04/2013 Document Reviewed: 04/26/2013  [employer] Patient Information 2015 [employer]. This information is not intended to replace advice given to you by your   health care provider. Make sure you discuss any questions you have with your health care provider.

## 2015-02-16 NOTE — ED Notes (Signed)
NAD noted at time of D/C. Pt denies questions or concerns. Pt ambulatory to the lobby at this time.  

## 2015-07-08 ENCOUNTER — Emergency Department: Payer: Medicaid Other

## 2015-07-08 ENCOUNTER — Encounter: Payer: Self-pay | Admitting: Emergency Medicine

## 2015-07-08 ENCOUNTER — Emergency Department
Admission: EM | Admit: 2015-07-08 | Discharge: 2015-07-08 | Disposition: A | Discharge status: Home / Self Care | Payer: Medicaid Other | Attending: Emergency Medicine | Admitting: Emergency Medicine

## 2015-07-08 DIAGNOSIS — Z87891 Personal history of nicotine dependence: Secondary | ICD-10-CM | POA: Insufficient documentation

## 2015-07-08 DIAGNOSIS — O23591 Infection of other part of genital tract in pregnancy, first trimester: Secondary | ICD-10-CM | POA: Insufficient documentation

## 2015-07-08 DIAGNOSIS — O200 Threatened abortion: Secondary | ICD-10-CM | POA: Diagnosis not present

## 2015-07-08 DIAGNOSIS — B9689 Other specified bacterial agents as the cause of diseases classified elsewhere: Secondary | ICD-10-CM

## 2015-07-08 DIAGNOSIS — Z791 Long term (current) use of non-steroidal anti-inflammatories (NSAID): Secondary | ICD-10-CM | POA: Diagnosis not present

## 2015-07-08 DIAGNOSIS — N760 Acute vaginitis: Secondary | ICD-10-CM

## 2015-07-08 DIAGNOSIS — O210 Mild hyperemesis gravidarum: Secondary | ICD-10-CM | POA: Diagnosis not present

## 2015-07-08 DIAGNOSIS — O209 Hemorrhage in early pregnancy, unspecified: Secondary | ICD-10-CM | POA: Diagnosis present

## 2015-07-08 DIAGNOSIS — Z3A08 8 weeks gestation of pregnancy: Secondary | ICD-10-CM | POA: Insufficient documentation

## 2015-07-08 DIAGNOSIS — R112 Nausea with vomiting, unspecified: Secondary | ICD-10-CM

## 2015-07-08 LAB — URINALYSIS COMPLETE WITH MICROSCOPIC (ARMC ONLY)
BILIRUBIN URINE: NEGATIVE
Bacteria, UA: NONE SEEN
Glucose, UA: NEGATIVE mg/dL
Hgb urine dipstick: NEGATIVE
KETONES UR: NEGATIVE mg/dL
LEUKOCYTES UA: NEGATIVE
Nitrite: NEGATIVE
PH: 7 (ref 5.0–8.0)
Protein, ur: NEGATIVE mg/dL
Specific Gravity, Urine: 1.017 (ref 1.005–1.030)

## 2015-07-08 LAB — WET PREP, GENITAL
TRICH WET PREP: NONE SEEN
YEAST WET PREP: NONE SEEN

## 2015-07-08 LAB — CBC WITH DIFFERENTIAL/PLATELET
BASOS ABS: 0 10*3/uL (ref 0–0.1)
BASOS PCT: 0 %
EOS ABS: 0.1 10*3/uL (ref 0–0.7)
EOS PCT: 2 %
HCT: 35.1 % (ref 35.0–47.0)
Hemoglobin: 11.8 g/dL — ABNORMAL LOW (ref 12.0–16.0)
Lymphocytes Relative: 34 %
Lymphs Abs: 2.4 10*3/uL (ref 1.0–3.6)
MCH: 32.1 pg (ref 26.0–34.0)
MCHC: 33.7 g/dL (ref 32.0–36.0)
MCV: 95.3 fL (ref 80.0–100.0)
Monocytes Absolute: 0.6 10*3/uL (ref 0.2–0.9)
Monocytes Relative: 9 %
Neutro Abs: 3.8 10*3/uL (ref 1.4–6.5)
Neutrophils Relative %: 55 %
PLATELETS: 257 10*3/uL (ref 150–440)
RBC: 3.69 MIL/uL — AB (ref 3.80–5.20)
RDW: 12.7 % (ref 11.5–14.5)
WBC: 7 10*3/uL (ref 3.6–11.0)

## 2015-07-08 LAB — COMPREHENSIVE METABOLIC PANEL
ALT: 53 U/L (ref 14–54)
AST: 32 U/L (ref 15–41)
Albumin: 3.8 g/dL (ref 3.5–5.0)
Alkaline Phosphatase: 73 U/L (ref 38–126)
Anion gap: 6 (ref 5–15)
BILIRUBIN TOTAL: 0.2 mg/dL — AB (ref 0.3–1.2)
BUN: 15 mg/dL (ref 6–20)
CO2: 26 mmol/L (ref 22–32)
CREATININE: 0.48 mg/dL (ref 0.44–1.00)
Calcium: 9.5 mg/dL (ref 8.9–10.3)
Chloride: 103 mmol/L (ref 101–111)
GFR calc Af Amer: >60 mL/min (ref >60)
Glucose, Bld: 114 mg/dL — ABNORMAL HIGH (ref 65–99)
Potassium: 3.4 mmol/L — ABNORMAL LOW (ref 3.5–5.1)
Sodium: 135 mmol/L (ref 135–145)
TOTAL PROTEIN: 7.1 g/dL (ref 6.5–8.1)

## 2015-07-08 LAB — CHLAMYDIA/NGC RT PCR (ARMC ONLY)
Chlamydia Tr: NOT DETECTED
N gonorrhoeae: NOT DETECTED

## 2015-07-08 LAB — HCG, QUANTITATIVE, PREGNANCY: hCG, Beta Chain, Quant, S: 130985 m[IU]/mL — ABNORMAL HIGH (ref <5)

## 2015-07-08 LAB — ABO/RH: ABO/RH(D): B POS

## 2015-07-08 MED ORDER — METRONIDAZOLE 500 MG PO TABS: 500.0000 mg | ORAL_TABLET | Freq: Two times a day (BID) | ORAL | Status: DC

## 2015-07-08 MED ORDER — DOXYLAMINE-PYRIDOXINE 10-10 MG PO TBEC: 2.0000 | DELAYED_RELEASE_TABLET | Freq: Every evening | ORAL | Status: DC | PRN

## 2015-07-08 MED ORDER — METRONIDAZOLE 500 MG PO TABS
500.0000 mg | ORAL_TABLET | Freq: Once | ORAL | Status: AC
  Administered 2015-07-08: 500 mg via ORAL
  Filled 2015-07-08: qty 1

## 2015-07-08 NOTE — ED Notes (Signed)
Pt has low abd pain with cramping.  Pt states she had positive home preg test.  [id number].   Pt has appt with health dept later in month.  Pt has not seen a doctor yet.  Pt also reports vag spotting.  No dysuria.  No back pain.  Pt alert.

## 2015-07-08 NOTE — ED Provider Notes (Signed)
[facility] Emergency Department Provider Note  ____________________________________________  Time seen: Approximately 6 PM  I have reviewed the triage vital signs and the nursing notes.   HISTORY  Chief Complaint Abdominal Pain and Vaginal Bleeding    HPI Erin Curtis is a 29 y.o. female who is a G5 P2 who is presenting today with lower abdominal cramping as well as spotting on the toilet tissue when she urinates. She denies any pain with urination. Denies any vaginal discharge. Denies any history of sexually transmitted diseases. She says she has vomited multiple times per day but has been doing that for several weeks. She is able to keep down bread she says. She is taking prenatal vitamins but no antinausea medicines. She is seen at the health department for her pregnancies.   Past Medical History  Diagnosis Date  . Depression     There are no active problems to display for this patient.   History reviewed. No pertinent past surgical history.  Current Outpatient Rx  Name  Route  Sig  Dispense  Refill  . cyclobenzaprine (FLEXERIL) 5 MG tablet   Oral   Take 1 tablet (5 mg total) by mouth every 8 (eight) hours as needed for muscle spasms.   30 tablet   0   . naproxen sodium (ANAPROX DS) 550 MG tablet   Oral   Take 1 tablet (550 mg total) by mouth 2 (two) times daily with a meal.   60 tablet   0     Allergies Review of patient's allergies indicates no known allergies.  No family history on file.  Social History Social History  Substance Use Topics  . Smoking status: Former Smoker -- 0.00 packs/day for 7 years  . Smokeless tobacco: Never Used  . Alcohol Use: No     Comment: socially    Review of Systems Constitutional: No fever/chills Eyes: No visual changes. ENT: No sore throat. Cardiovascular: Denies chest pain. Respiratory: Denies shortness of breath. Gastrointestinal:   No diarrhea.  No constipation. Genitourinary:  Negative for dysuria. Musculoskeletal: Negative for back pain. Skin: Negative for rash. Neurological: Negative for headaches, focal weakness or numbness.  10-point ROS otherwise negative.  ____________________________________________   PHYSICAL EXAM:  VITAL SIGNS: ED Triage Vitals  Enc Vitals Group     BP 07/08/15 1737 136/73 mmHg     Pulse Rate 07/08/15 1737 85     Resp 07/08/15 1737 18     Temp 07/08/15 1737 99.1 F (37.3 C)     Temp Source 07/08/15 1737 Oral     SpO2 07/08/15 1737 100 %     Weight 07/08/15 1737 115 lb (52.164 kg)     Height 07/08/15 1737  (1.727 m)     Head Cir --      Peak Flow --      Pain Score 07/08/15 1738 7     Pain Loc --      Pain Edu? --      Excl. in GC? --     Constitutional: Alert and oriented. Well appearing and in no acute distress. Eyes: Conjunctivae are normal. PERRL. EOMI. Head: Atraumatic. Nose: No congestion/rhinnorhea. Mouth/Throat: Mucous membranes are moist.  Oropharynx non-erythematous. Neck: No stridor.   Cardiovascular: Normal rate, regular rhythm. Grossly normal heart sounds.  Good peripheral circulation. Respiratory: Normal respiratory effort.  No retractions. Lungs CTAB. Gastrointestinal: Soft with mild suprapubic tenderness palpation. There is no right lower or left lower quadrant tenderness to palpation. No distention.  No abdominal bruits. No CVA tenderness. Genitourinary: Normal external appearance. Speculum exam with mild to moderate amount of white discharge. Bimanual exam with closed cervix. There is no CMT. There is mild uterine tenderness to palpation with no adnexal tenderness to palpation. Musculoskeletal: No lower extremity tenderness nor edema.  No joint effusions. Neurologic:  Normal speech and language. No gross focal neurologic deficits are appreciated. No gait instability. Skin:  Skin is warm, dry and intact. No rash noted. Psychiatric: Mood and affect are normal. Speech and behavior are  normal.  ____________________________________________   LABS (all labs ordered are listed, but only abnormal results are displayed)  Labs Reviewed  WET PREP, GENITAL - Abnormal; Notable for the following:    Clue Cells Wet Prep HPF POC FEW (*)    WBC, Wet Prep HPF POC FEW (*)    All other components within normal limits  COMPREHENSIVE METABOLIC PANEL - Abnormal; Notable for the following:    Potassium 3.4 (*)    Glucose, Bld 114 (*)    Total Bilirubin 0.2 (*)    All other components within normal limits  CBC WITH DIFFERENTIAL/PLATELET - Abnormal; Notable for the following:    RBC 3.69 (*)    Hemoglobin 11.8 (*)    All other components within normal limits  URINALYSIS COMPLETEWITH MICROSCOPIC ([facility] ONLY) - Abnormal; Notable for the following:    Color, Urine YELLOW (*)    APPearance CLEAR (*)    Squamous Epithelial / LPF 0-5 (*)    All other components within normal limits  HCG, QUANTITATIVE, PREGNANCY - Abnormal; Notable for the following:    hCG, Beta Chain, Quant, [place] [id number] (*)    All other components within normal limits  CHLAMYDIA/NGC RT PCR ([facility] ONLY)  ABO/RH   ____________________________________________  EKG   ____________________________________________  RADIOLOGY  Ultrasound of 8 week in 3 days intrauterine pregnancy. There is a heart rate of 180 bpm. ____________________________________________   PROCEDURES    ____________________________________________   INITIAL IMPRESSION / ASSESSMENT AND PLAN / ED COURSE  Pertinent labs & imaging results that were available during my care of the patient were reviewed by me and considered in my medical decision making (see chart for details).  ----------------------------------------- 8:48 PM on 07/08/2015 -----------------------------------------  Discussed the results with the patient including ultrasound as well as wet prep. We discussed treatment with Flagyl as well as being sent home unlikely  just for nausea and vomiting. The patient will call the health department to try to have her appointment moved up to within the next 7 days. We also discussed return precautions including increased bleeding as well as pain. The patient understands the plan and is willing to comply. ____________________________________________   FINAL CLINICAL IMPRESSION(S) / ED DIAGNOSES  Acute threatened abortion. Acute bacterial vaginosis. Acute nausea and vomiting of pregnancy. Initial visit.    Myrna Blazer, MD 07/08/15 [phone]

## 2015-07-08 NOTE — Discharge Instructions (Signed)
Bacterial Vaginosis Bacterial vaginosis is a vaginal infection that occurs when the normal balance of bacteria in the vagina is disrupted. It results from an overgrowth of certain bacteria. This is the most common vaginal infection in women of childbearing age. Treatment is important to prevent complications, especially in pregnant women, as it can cause a premature delivery. CAUSES  Bacterial vaginosis is caused by an increase in harmful bacteria that are normally present in smaller amounts in the vagina. Several different kinds of bacteria can cause bacterial vaginosis. However, the reason that the condition develops is not fully understood. RISK FACTORS Certain activities or behaviors can put you at an increased risk of developing bacterial vaginosis, including:  Having a new sex partner or multiple sex partners.  Douching.  Using an intrauterine device (IUD) for contraception. Women do not get bacterial vaginosis from toilet seats, bedding, swimming pools, or contact with objects around them. SIGNS AND SYMPTOMS  Some women with bacterial vaginosis have no signs or symptoms. Common symptoms include:  Grey vaginal discharge.  A fishlike odor with discharge, especially after sexual intercourse.  Itching or burning of the vagina and vulva.  Burning or pain with urination. DIAGNOSIS  Your health care provider will take a medical history and examine the vagina for signs of bacterial vaginosis. A sample of vaginal fluid may be taken. Your health care provider will look at this sample under a microscope to check for bacteria and abnormal cells. A vaginal pH test may also be done.  TREATMENT  Bacterial vaginosis may be treated with antibiotic medicines. These may be given in the form of a pill or a vaginal cream. A second round of antibiotics may be prescribed if the condition comes back after treatment. Because bacterial vaginosis increases your risk for sexually transmitted diseases, getting  treated can help reduce your risk for chlamydia, gonorrhea, HIV, and herpes. HOME CARE INSTRUCTIONS   Only take over-the-counter or prescription medicines as directed by your health care provider.  If antibiotic medicine was prescribed, take it as directed. Make sure you finish it even if you start to feel better.  Tell all sexual partners that you have a vaginal infection. They should see their health care provider and be treated if they have problems, such as a mild rash or itching.  During treatment, it is important that you follow these instructions:  Avoid sexual activity or use condoms correctly.  Do not douche.  Avoid alcohol as directed by your health care provider.  Avoid breastfeeding as directed by your health care provider. SEEK MEDICAL CARE IF:   Your symptoms are not improving after 3 days of treatment.  You have increased discharge or pain.  You have a fever. MAKE SURE YOU:   Understand these instructions.  Will watch your condition.  Will get help right away if you are not doing well or get worse. FOR MORE INFORMATION  Centers for Disease Control and Prevention, Division of STD Prevention: SolutionApps.co.zawww.cdc.gov/std American Sexual Health Association (ASHA): www.ashastd.org    This information is not intended to replace advice given to you by your health care provider. Make sure you discuss any questions you have with your health care provider.   Document Released: 09/13/2005 Document Revised: 10/04/2014 Document Reviewed: 04/25/2013 Elsevier Interactive Patient Education 2016 Elsevier Inc.  Nausea and Vomiting Nausea is a sick feeling that often comes before throwing up (vomiting). Vomiting is a reflex where stomach contents come out of your mouth. Vomiting can cause severe loss of body fluids (dehydration).  Children and elderly adults can become dehydrated quickly, especially if they also have diarrhea. Nausea and vomiting are symptoms of a condition or disease. It  is important to find the cause of your symptoms. CAUSES   Direct irritation of the stomach lining. This irritation can result from increased acid production (gastroesophageal reflux disease), infection, food poisoning, taking certain medicines (such as nonsteroidal anti-inflammatory drugs), alcohol use, or tobacco use.  Signals from the brain.These signals could be caused by a headache, heat exposure, an inner ear disturbance, increased pressure in the brain from injury, infection, a tumor, or a concussion, pain, emotional stimulus, or metabolic problems.  An obstruction in the gastrointestinal tract (bowel obstruction).  Illnesses such as diabetes, hepatitis, gallbladder problems, appendicitis, kidney problems, cancer, sepsis, atypical symptoms of a heart attack, or eating disorders.  Medical treatments such as chemotherapy and radiation.  Receiving medicine that makes you sleep (general anesthetic) during surgery. DIAGNOSIS Your caregiver may ask for tests to be done if the problems do not improve after a few days. Tests may also be done if symptoms are severe or if the reason for the nausea and vomiting is not clear. Tests may include:  Urine tests.  Blood tests.  Stool tests.  Cultures (to look for evidence of infection).  X-rays or other imaging studies. Test results can help your caregiver make decisions about treatment or the need for additional tests. TREATMENT You need to stay well hydrated. Drink frequently but in small amounts.You may wish to drink water, sports drinks, clear broth, or eat frozen ice pops or gelatin dessert to help stay hydrated.When you eat, eating slowly may help prevent nausea.There are also some antinausea medicines that may help prevent nausea. HOME CARE INSTRUCTIONS   Take all medicine as directed by your caregiver.  If you do not have an appetite, do not force yourself to eat. However, you must continue to drink fluids.  If you have an  appetite, eat a normal diet unless your caregiver tells you differently.  Eat a variety of complex carbohydrates (rice, wheat, potatoes, bread), lean meats, yogurt, fruits, and vegetables.  Avoid high-fat foods because they are more difficult to digest.  Drink enough water and fluids to keep your urine clear or pale yellow.  If you are dehydrated, ask your caregiver for specific rehydration instructions. Signs of dehydration may include:  Severe thirst.  Dry lips and mouth.  Dizziness.  Dark urine.  Decreasing urine frequency and amount.  Confusion.  Rapid breathing or pulse. SEEK IMMEDIATE MEDICAL CARE IF:   You have blood or brown flecks (like coffee grounds) in your vomit.  You have black or bloody stools.  You have a severe headache or stiff neck.  You are confused.  You have severe abdominal pain.  You have chest pain or trouble breathing.  You do not urinate at least once every 8 hours.  You develop cold or clammy skin.  You continue to vomit for longer than 24 to 48 hours.  You have a fever. MAKE SURE YOU:   Understand these instructions.  Will watch your condition.  Will get help right away if you are not doing well or get worse.   This information is not intended to replace advice given to you by your health care provider. Make sure you discuss any questions you have with your health care provider.   Document Released: 09/13/2005 Document Revised: 12/06/2011 Document Reviewed: 02/10/2011 Elsevier Interactive Patient Education 2016 [employer].  Threatened Miscarriage A threatened miscarriage  occurs when you have vaginal bleeding during your first 20 weeks of pregnancy but the pregnancy has not ended. If you have vaginal bleeding during this time, your health care provider will do tests to make sure you are still pregnant. If the tests show you are still pregnant and the developing baby (fetus) inside your womb (uterus) is still growing, your  condition is considered a threatened miscarriage. A threatened miscarriage does not mean your pregnancy will end, but it does increase the risk of losing your pregnancy (complete miscarriage). CAUSES  The cause of a threatened miscarriage is usually not known. If you go on to have a complete miscarriage, the most common cause is an abnormal number of chromosomes in the developing baby. Chromosomes are the structures inside cells that hold all your genetic material. Some causes of vaginal bleeding that do not result in miscarriage include:  Having sex.  Having an infection.  Normal hormone changes of pregnancy.  Bleeding that occurs when an egg implants in your uterus. RISK FACTORS Risk factors for bleeding in early pregnancy include:  Obesity.  Smoking.  Drinking excessive amounts of alcohol or caffeine.  Recreational drug use. SIGNS AND SYMPTOMS  Light vaginal bleeding.  Mild abdominal pain or cramps. DIAGNOSIS  If you have bleeding with or without abdominal pain before 20 weeks of pregnancy, your health care provider will do tests to check whether you are still pregnant. One important test involves using sound waves and a computer (ultrasound) to create images of the inside of your uterus. Other tests include an internal exam of your vagina and uterus (pelvic exam) and measurement of your baby's heart rate.  You may be diagnosed with a threatened miscarriage if:  Ultrasound testing shows you are still pregnant.  Your baby's heart rate is strong.  A pelvic exam shows that the opening between your uterus and your vagina (cervix) is closed.  Your heart rate and blood pressure are stable.  Blood tests confirm you are still pregnant. TREATMENT  No treatments have been shown to prevent a threatened miscarriage from going on to a complete miscarriage. However, the right home care is important.  HOME CARE INSTRUCTIONS   Make sure you keep all your appointments for prenatal  care. This is very important.  Get plenty of rest.  Do not have sex or use tampons if you have vaginal bleeding.  Do not douche.  Do not smoke or use recreational drugs.  Do not drink alcohol.  Avoid caffeine. SEEK MEDICAL CARE IF:  You have light vaginal bleeding or spotting while pregnant.  You have abdominal pain or cramping.  You have a fever. SEEK IMMEDIATE MEDICAL CARE IF:  You have heavy vaginal bleeding.  You have blood clots coming from your vagina.  You have severe low back pain or abdominal cramps.  You have fever, chills, and severe abdominal pain. MAKE SURE YOU:  Understand these instructions.  Will watch your condition.  Will get help right away if you are not doing well or get worse.   This information is not intended to replace advice given to you by your health care provider. Make sure you discuss any questions you have with your health care provider.   Document Released: 09/13/2005 Document Revised: 09/18/2013 Document Reviewed: 07/10/2013 Elsevier Interactive Patient Education [employer]

## 2015-07-08 NOTE — ED Notes (Signed)
Pt to ED with c/o lower abd. Cramping and spotting vaginal bleeding, states she just took a home pregnancy test a couple of weeks ago and it was positive, unsure how far along she is

## 2015-07-14 ENCOUNTER — Encounter: Payer: Self-pay | Admitting: Emergency Medicine

## 2015-07-14 ENCOUNTER — Emergency Department
Admission: EM | Admit: 2015-07-14 | Discharge: 2015-07-14 | Disposition: A | Discharge status: Home / Self Care | Payer: Medicaid Other | Attending: Emergency Medicine | Admitting: Emergency Medicine

## 2015-07-14 DIAGNOSIS — Z87891 Personal history of nicotine dependence: Secondary | ICD-10-CM | POA: Insufficient documentation

## 2015-07-14 DIAGNOSIS — Z791 Long term (current) use of non-steroidal anti-inflammatories (NSAID): Secondary | ICD-10-CM | POA: Diagnosis not present

## 2015-07-14 DIAGNOSIS — Z3A09 9 weeks gestation of pregnancy: Secondary | ICD-10-CM | POA: Diagnosis not present

## 2015-07-14 DIAGNOSIS — O210 Mild hyperemesis gravidarum: Secondary | ICD-10-CM | POA: Diagnosis present

## 2015-07-14 DIAGNOSIS — Z79899 Other long term (current) drug therapy: Secondary | ICD-10-CM | POA: Insufficient documentation

## 2015-07-14 LAB — COMPREHENSIVE METABOLIC PANEL
ALBUMIN: 4.2 g/dL (ref 3.5–5.0)
ALK PHOS: 77 U/L (ref 38–126)
ALT: 49 U/L (ref 14–54)
AST: 35 U/L (ref 15–41)
Anion gap: 7 (ref 5–15)
BUN: 15 mg/dL (ref 6–20)
CHLORIDE: 102 mmol/L (ref 101–111)
CO2: 24 mmol/L (ref 22–32)
CREATININE: 0.41 mg/dL — AB (ref 0.44–1.00)
Calcium: 9.4 mg/dL (ref 8.9–10.3)
GFR calc Af Amer: >60 mL/min (ref >60)
GFR calc non Af Amer: >60 mL/min (ref >60)
Glucose, Bld: 98 mg/dL (ref 65–99)
Potassium: 3.7 mmol/L (ref 3.5–5.1)
SODIUM: 133 mmol/L — AB (ref 135–145)
Total Bilirubin: 0.4 mg/dL (ref 0.3–1.2)
Total Protein: 8 g/dL (ref 6.5–8.1)

## 2015-07-14 LAB — CBC
HCT: 38.5 % (ref 35.0–47.0)
Hemoglobin: 13.1 g/dL (ref 12.0–16.0)
MCH: 32.3 pg (ref 26.0–34.0)
MCHC: 34 g/dL (ref 32.0–36.0)
MCV: 94.9 fL (ref 80.0–100.0)
PLATELETS: 307 10*3/uL (ref 150–440)
RBC: 4.05 MIL/uL (ref 3.80–5.20)
RDW: 12.5 % (ref 11.5–14.5)
WBC: 6.8 10*3/uL (ref 3.6–11.0)

## 2015-07-14 LAB — URINALYSIS COMPLETE WITH MICROSCOPIC (ARMC ONLY)
BILIRUBIN URINE: NEGATIVE
Glucose, UA: NEGATIVE mg/dL
HGB URINE DIPSTICK: NEGATIVE
Nitrite: NEGATIVE
PH: 5 (ref 5.0–8.0)
Protein, ur: 30 mg/dL — AB
SPECIFIC GRAVITY, URINE: 1.028 (ref 1.005–1.030)

## 2015-07-14 LAB — POCT PREGNANCY, URINE: Preg Test, Ur: POSITIVE — AB

## 2015-07-14 MED ORDER — ONDANSETRON 4 MG PO TBDP
8.0000 mg | ORAL_TABLET | Freq: Once | ORAL | Status: AC
Start: 2015-07-14 — End: 2015-07-14
  Administered 2015-07-14: 8 mg via ORAL
  Filled 2015-07-14: qty 2

## 2015-07-14 MED ORDER — DIPHENHYDRAMINE HCL 25 MG PO CAPS
25.0000 mg | ORAL_CAPSULE | Freq: Once | ORAL | Status: AC
  Administered 2015-07-14: 25 mg via ORAL
  Filled 2015-07-14: qty 1

## 2015-07-14 MED ORDER — PROMETHAZINE HCL 25 MG PO TABS: 25.0000 mg | ORAL_TABLET | Freq: Four times a day (QID) | ORAL | Status: DC | PRN

## 2015-07-14 NOTE — ED Notes (Signed)
Pt presents with vomiting since this am. Pt states she is nine weeks pregnant and wants to make sure that she is not dehydrated.

## 2015-07-14 NOTE — ED Provider Notes (Signed)
[facility] Emergency Department Provider Note  ____________________________________________  Time seen: 12:45 PM  I have reviewed the triage vital signs and the nursing notes.   HISTORY  Chief Complaint Emesis During Pregnancy    HPI Erin Curtis is a 29 y.o. female who is about [redacted] weeks pregnant [facility] who complains of nausea and vomiting. She states that she's had morning sickness for several weeks and takes metoclopramide to control the symptoms. However, over the last 2 days has been particularly worse. She also recently treated antibiotics for bacterial vaginosis. No fevers chills sweats chest pain shortness of breath belly pain back pain or dysuria frequency urgency, cramping or vaginal bleeding at this time. No dizziness or syncope.     Past Medical History  Diagnosis Date  . Depression      There are no active problems to display for this patient.  first trimester pregnancy   History reviewed. No pertinent past surgical history.   Current Outpatient Rx  Name  Route  Sig  Dispense  Refill  . cyclobenzaprine (FLEXERIL) 5 MG tablet   Oral   Take 1 tablet (5 mg total) by mouth every 8 (eight) hours as needed for muscle spasms.   30 tablet   0   . Doxylamine-Pyridoxine 10-10 MG TBEC   Oral   Take 2 tablets by mouth at bedtime as needed (nausea/vomiting).   30 tablet   0   . metroNIDAZOLE (FLAGYL) 500 MG tablet   Oral   Take 1 tablet (500 mg total) by mouth 2 (two) times daily.   14 tablet   0   . naproxen sodium (ANAPROX DS) 550 MG tablet   Oral   Take 1 tablet (550 mg total) by mouth 2 (two) times daily with a meal.   60 tablet   0      Allergies Review of patient's allergies indicates no known allergies.   No family history on file.  Social History Social History  Substance Use Topics  . Smoking status: Former Smoker -- 0.00 packs/day for 7 years  . Smokeless tobacco:  Never Used  . Alcohol Use: No     Comment: socially    Review of Systems  Constitutional:   No fever or chills. No weight changes Eyes:   No blurry vision or double vision.  ENT:   No sore throat. Cardiovascular:   No chest pain. Respiratory:   No dyspnea or cough. Gastrointestinal:   Negative for abdominal pain, or diarrhea. Positive nausea and vomiting.  No BRBPR or melena. Genitourinary:   Negative for dysuria, urinary retention, bloody urine, or difficulty urinating. Musculoskeletal:   Negative for back pain. No joint swelling or pain. Skin:   Negative for rash. Neurological:   Negative for headaches, focal weakness or numbness. Psychiatric:  No anxiety or depression.   Endocrine:  No hot/cold intolerance, changes in energy, or sleep difficulty.  10-point ROS otherwise negative.  ____________________________________________   PHYSICAL EXAM:  VITAL SIGNS: ED Triage Vitals  Enc Vitals Group     BP 07/14/15 0935 105/69 mmHg     Pulse Rate 07/14/15 0935 84     Resp 07/14/15 0935 18     Temp 07/14/15 0935 98.3 F (36.8 C)     Temp Source 07/14/15 0935 Oral     SpO2 07/14/15 0935 98 %     Weight 07/14/15 0935 150 lb (68.04 kg)     Height 07/14/15 0935 5\' 8"  (  1.727 m)     Head Cir --      Peak Flow --      Pain Score 07/14/15 1251 4     Pain Loc --      Pain Edu? --      Excl. in GC? --      Constitutional:   Alert and oriented. Well appearing and in no distress. Eyes:   No scleral icterus. No conjunctival pallor. PERRL. EOMI ENT   Head:   Normocephalic and atraumatic.   Nose:   No congestion/rhinnorhea. No septal hematoma   Mouth/Throat:   MMM, no pharyngeal erythema. No peritonsillar mass. No uvula shift.   Neck:   No stridor. No SubQ emphysema. No meningismus. Hematological/Lymphatic/Immunilogical:   No cervical lymphadenopathy. Cardiovascular:   RRR. Normal and symmetric distal pulses are present in all extremities. No murmurs, rubs, or  gallops. Respiratory:   Normal respiratory effort without tachypnea nor retractions. Breath sounds are clear and equal bilaterally. No wheezes/rales/rhonchi. Gastrointestinal:   Soft and nontender. No distention. There is no CVA tenderness.  No rebound, rigidity, or guarding. Genitourinary:   deferred Musculoskeletal:   Nontender with normal range of motion in all extremities. No joint effusions.  No lower extremity tenderness.  No edema. Neurologic:   Normal speech and language.  CN 2-10 normal. Motor grossly intact. No pronator drift.  Normal gait. No gross focal neurologic deficits are appreciated.  Skin:    Skin is warm, dry and intact. No rash noted.  No petechiae, purpura, or bullae. Psychiatric:   Mood and affect are normal. Speech and behavior are normal. Patient exhibits appropriate insight and judgment.  ____________________________________________    LABS (pertinent positives/negatives) (all labs ordered are listed, but only abnormal results are displayed) Labs Reviewed  COMPREHENSIVE METABOLIC PANEL - Abnormal; Notable for the following:    Sodium 133 (*)    Creatinine, Ser 0.41 (*)    All other components within normal limits  URINALYSIS COMPLETEWITH MICROSCOPIC ([facility] ONLY) - Abnormal; Notable for the following:    Color, Urine YELLOW (*)    APPearance HAZY (*)    Ketones, ur TRACE (*)    Protein, ur 30 (*)    Leukocytes, UA TRACE (*)    Bacteria, UA RARE (*)    Squamous Epithelial / LPF 6-30 (*)    All other components within normal limits  POCT PREGNANCY, URINE - Abnormal; Notable for the following:    Preg Test, Ur POSITIVE (*)    All other components within normal limits  CBC  POC URINE PREG, ED   ____________________________________________   EKG    ____________________________________________    RADIOLOGY    ____________________________________________   PROCEDURES   ____________________________________________   INITIAL IMPRESSION /  ASSESSMENT AND PLAN / ED COURSE  Pertinent labs & imaging results that were available during my care of the patient were reviewed by me and considered in my medical decision making (see chart for details).  Patient presents with nausea and vomiting of pregnancy, likely exacerbated by recent Flagyl use for bacterial vaginosis. Labs are reassuring, exam reassuring. No hyperemesis or ketosis or severe dehydration. We'll give antiemetics here in the emergency department and plan to prescribe her antiemetics for further symptom control. She was previously prescribed empirically just but does not seem to have been able to obtain it. Patient tolerating oral intake in the emergency department.     ____________________________________________   FINAL CLINICAL IMPRESSION(S) / ED DIAGNOSES  Final diagnoses:  None   nausea  and vomiting of pregnancy First trimester pregnancy    Sharman Cheek, MD 07/14/15 1343

## 2015-07-14 NOTE — Discharge Instructions (Signed)
Morning Sickness °Morning sickness is when you feel sick to your stomach (nauseous) during pregnancy. This nauseous feeling may or may not come with vomiting. It often occurs in the morning but can be a problem any time of day. Morning sickness is most common during the first trimester, but it may continue throughout pregnancy. While morning sickness is unpleasant, it is usually harmless unless you develop severe and continual vomiting (hyperemesis gravidarum). This condition requires more intense treatment.  °CAUSES  °The cause of morning sickness is not completely known but seems to be related to normal hormonal changes that occur in pregnancy. °RISK FACTORS °You are at greater risk if you: °· Experienced nausea or vomiting before your pregnancy. °· Had morning sickness during a previous pregnancy. °· Are pregnant with more than one baby, such as twins. °TREATMENT  °Do not use any medicines (prescription, over-the-counter, or herbal) for morning sickness without first talking to your health care provider. Your health care provider may prescribe or recommend: °· Vitamin B6 supplements. °· Anti-nausea medicines. °· The herbal medicine ginger. °HOME CARE INSTRUCTIONS  °· Only take over-the-counter or prescription medicines as directed by your health care provider. °· Taking multivitamins before getting pregnant can prevent or decrease the severity of morning sickness in most women. °· Eat a piece of dry toast or unsalted crackers before getting out of bed in the morning. °· Eat five or six small meals a day. °· Eat dry and bland foods (rice, baked potato). Foods high in carbohydrates are often helpful. °· Do not drink liquids with your meals. Drink liquids between meals. °· Avoid greasy, fatty, and spicy foods. °· Get someone to cook for you if the smell of any food causes nausea and vomiting. °· If you feel nauseous after taking prenatal vitamins, take the vitamins at night or with a snack.  °· Snack on protein  foods (nuts, yogurt, cheese) between meals if you are hungry. °· Eat unsweetened gelatins for desserts. °· Wearing an acupressure wristband (worn for sea sickness) may be helpful. °· Acupuncture may be helpful. °· Do not smoke. °· Get a humidifier to keep the air in your house free of odors. °· Get plenty of fresh air. °SEEK MEDICAL CARE IF:  °· Your home remedies are not working, and you need medicine. °· You feel dizzy or lightheaded. °· You are losing weight. °SEEK IMMEDIATE MEDICAL CARE IF:  °· You have persistent and uncontrolled nausea and vomiting. °· You pass out (faint). °MAKE SURE YOU: °· Understand these instructions. °· Will watch your condition. °· Will get help right away if you are not doing well or get worse. °  °This information is not intended to replace advice given to you by your health care provider. Make sure you discuss any questions you have with your health care provider. °  °Document Released: 11/04/2006 Document Revised: 09/18/2013 Document Reviewed: 02/28/2013 °Elsevier Interactive Patient Education ©2016 Elsevier Inc. ° ° ° °Eating Plan for Hyperemesis Gravidarum °Severe cases of hyperemesis gravidarum can lead to dehydration and malnutrition. The hyperemesis eating plan is one way to lessen the symptoms of nausea and vomiting. It is often used with prescribed medicines to control your symptoms.  °WHAT CAN I DO TO RELIEVE MY SYMPTOMS? °Listen to your body. Everyone is different and has different preferences. Find what works best for you. Some of the following things may help: °· Eat and drink slowly. °· Eat 5-6 small meals daily instead of 3 large meals.   °· Eat crackers before you   get out of bed in the morning.   °· Starchy foods are usually well tolerated (such as cereal, toast, bread, potatoes, pasta, rice, and pretzels).   °· Ginger may help with nausea. Add ¼ tsp ground ginger to hot tea or choose ginger tea.   °· Try drinking 100% fruit juice or an electrolyte drink. °· Continue  to take your prenatal vitamins as directed by your health care provider. If you are having trouble taking your prenatal vitamins, talk with your health care provider about different options. °· Include at least 1 serving of protein with your meals and snacks (such as meats or poultry, beans, nuts, eggs, or yogurt). Try eating a protein-rich snack before bed (such as cheese and crackers or a half turkey or peanut butter sandwich). °WHAT THINGS SHOULD I AVOID TO REDUCE MY SYMPTOMS? °The following things may help reduce your symptoms: °· Avoid foods with strong smells. Try eating meals in well-ventilated areas that are free of odors. °· Avoid drinking water or other beverages with meals. Try not to drink anything less than 30 minutes before and after meals. °· Avoid drinking more than 1 cup of fluid at a time. °· Avoid fried or high-fat foods, such as butter and cream sauces. °· Avoid spicy foods. °· Avoid skipping meals the best you can. Nausea can be more intense on an empty stomach. If you cannot tolerate food at that time, do not force it. Try sucking on ice chips or other frozen items and make up the calories later. °· Avoid lying down within 2 hours after eating. °  °This information is not intended to replace advice given to you by your health care provider. Make sure you discuss any questions you have with your health care provider. °  °Document Released: 07/11/2007 Document Revised: 09/18/2013 Document Reviewed: 07/18/2013 °Elsevier Interactive Patient Education ©2016 Elsevier Inc. ° °

## 2015-07-23 ENCOUNTER — Other Ambulatory Visit: Payer: Self-pay | Admitting: Advanced Practice Midwife

## 2015-08-07 ENCOUNTER — Ambulatory Visit: Payer: Medicaid Other

## 2015-08-14 ENCOUNTER — Ambulatory Visit: Payer: Medicaid Other

## 2015-08-14 ENCOUNTER — Ambulatory Visit
Admission: RE | Admit: 2015-08-14 | Discharge: 2015-08-14 | Disposition: A | Discharge status: Home / Self Care | Payer: Medicaid Other | Source: Ambulatory Visit | Attending: Advanced Practice Midwife | Admitting: Advanced Practice Midwife

## 2015-08-14 ENCOUNTER — Ambulatory Visit (HOSPITAL_BASED_OUTPATIENT_CLINIC_OR_DEPARTMENT_OTHER)
Admission: RE | Admit: 2015-08-14 | Discharge: 2015-08-14 | Disposition: A | Discharge status: Home / Self Care | Payer: Medicaid Other | Source: Ambulatory Visit | Attending: Obstetrics and Gynecology | Admitting: Obstetrics and Gynecology

## 2015-08-14 DIAGNOSIS — O210 Mild hyperemesis gravidarum: Secondary | ICD-10-CM | POA: Diagnosis present

## 2015-08-14 DIAGNOSIS — Z3A22 22 weeks gestation of pregnancy: Secondary | ICD-10-CM | POA: Insufficient documentation

## 2015-08-14 DIAGNOSIS — O26891 Other specified pregnancy related conditions, first trimester: Secondary | ICD-10-CM | POA: Diagnosis not present

## 2015-08-14 DIAGNOSIS — Z36 Encounter for antenatal screening of mother: Secondary | ICD-10-CM

## 2015-08-14 DIAGNOSIS — Z369 Encounter for antenatal screening, unspecified: Secondary | ICD-10-CM | POA: Insufficient documentation

## 2015-08-14 DIAGNOSIS — O99891 Other specified diseases and conditions complicating pregnancy: Secondary | ICD-10-CM

## 2015-08-14 DIAGNOSIS — O09299 Supervision of pregnancy with other poor reproductive or obstetric history, unspecified trimester: Secondary | ICD-10-CM | POA: Insufficient documentation

## 2015-08-14 DIAGNOSIS — R7989 Other specified abnormal findings of blood chemistry: Secondary | ICD-10-CM | POA: Insufficient documentation

## 2015-08-14 DIAGNOSIS — F329 Major depressive disorder, single episode, unspecified: Secondary | ICD-10-CM | POA: Insufficient documentation

## 2015-08-14 DIAGNOSIS — O09292 Supervision of pregnancy with other poor reproductive or obstetric history, second trimester: Secondary | ICD-10-CM

## 2015-08-14 DIAGNOSIS — O9989 Other specified diseases and conditions complicating pregnancy, childbirth and the puerperium: Secondary | ICD-10-CM

## 2015-08-14 DIAGNOSIS — M549 Dorsalgia, unspecified: Secondary | ICD-10-CM

## 2015-08-14 NOTE — Progress Notes (Signed)
Pt seen  Normal u/s first tri screen performed after gen counseling Erin RalphLivingston, Tilden Broz, MD

## 2015-08-14 NOTE — Progress Notes (Signed)
Referring physician:  [facility] Length of Consultation: 30 minute consultation   [name]  was referred to [facility] for genetic counseling to review prenatal screening and testing options.  This note summarizes the information we discussed.    We offered the following routine screening tests for this pregnancy:  First trimester screening, which includes nuchal translucency ultrasound screen and first trimester maternal serum marker screening.  The nuchal translucency has approximately an 80% detection rate for Down syndrome and can be positive for other chromosome abnormalities as well as congenital heart defects.  When combined with a maternal serum marker screening, the detection rate is up to 90% for Down syndrome and up to 97% for trisomy 18.     Maternal serum marker screening, a blood test that measures pregnancy proteins, can provide risk assessments for Down syndrome, trisomy 18, and open neural tube defects (spina bifida, anencephaly). Because it does not directly examine the fetus, it cannot positively diagnose or rule out these problems.  Targeted ultrasound uses high frequency sound waves to create an image of the developing fetus.  An ultrasound is often recommended as a routine means of evaluating the pregnancy.  It is also used to screen for fetal anatomy problems (for example, a heart defect) that might be suggestive of a chromosomal or other abnormality.   Should these screening tests indicate an increased concern, then the following additional testing options would be offered:  The chorionic villus sampling procedure is available for first trimester chromosome analysis.  This involves the withdrawal of a small amount of chorionic villi (tissue from the developing placenta).  Risk of pregnancy loss is estimated to be approximately 1 in 200 to 1 in 100 (0.5 to 1%).  There is approximately a 1% (1 in 100) chance that the CVS chromosome  results will be unclear.  Chorionic villi cannot be tested for neural tube defects.     Amniocentesis involves the removal of a small amount of amniotic fluid from the sac surrounding the fetus with the use of a thin needle inserted through the maternal abdomen and uterus.  Ultrasound guidance is used throughout the procedure.  Fetal cells from amniotic fluid are directly evaluated and > 99.5% of chromosome problems and > 98% of open neural tube defects can be detected. This procedure is generally performed after the 15th week of pregnancy.  The main risks to this procedure include complications leading to miscarriage in less than 1 in 200 cases (0.5%).  As another option for information if the pregnancy is suspected to be an an increased chance for certain chromosome conditions, we also reviewed the availability of cell free fetal DNA testing from maternal blood to determine whether or not the baby may have either Down syndrome, trisomy 3013, or trisomy 7418.  This test utilizes a maternal blood sample and DNA sequencing technology to isolate circulating cell free fetal DNA from maternal plasma.  The fetal DNA can then be analyzed for DNA sequences that are derived from the three most common chromosomes involved in aneuploidy, chromosomes 13, 18, and 21.  If the overall amount of DNA is greater than the expected level for any of these chromosomes, aneuploidy is suspected.  While we do not consider it a replacement for invasive testing and karyotype analysis, a negative result from this testing would be reassuring, though not a guarantee of a normal chromosome complement for the baby.  An abnormal result is certainly suggestive of an abnormal chromosome  complement, though we would still recommend CVS or amniocentesis to confirm any findings from this testing.  We reviewed the detailed family history and pregnancy history obtained in her last pregnancy.  The family history was reported to be unremarkable for birth  defects, mental retardation, recurrent pregnancy loss or known chromosome abnormalities. She reported no changes in the history since her last pregnancy.  Ms. Deblanc reported that this is her fourth pregnancy, the second with her current partner.  She has a healthy 64 year old daughter from a prior relationship and a 30 year old from this partner.  Her first pregnancy ended in 22 week delivery following PPROM with infection.  See the MFM consultation note for details and recommendations.  She reported no complications or exposures in this pregnancy that would be expected to increase the risk for birth defects.  She stopped smoking when she became pregnant.  After consideration of the options, Ms. Shiffman elected to proceed with first trimester screening.  An ultrasound was performed at the time of the visit.  The gestational age was consistent with  13 weeks.  Fetal anatomy could not be assessed due to early gestational age.  Please refer to the ultrasound report for details of that study.  Ms. Boies was encouraged to call with questions or concerns.  We can be contacted at [phone].   Cherly Anderson, MS, CGC

## 2015-08-14 NOTE — Progress Notes (Signed)
[facility] Consultation   Chief Complaint: pregnant h/o 22 week loss, low TSH earlier in pregnancy   HPI: [name] is a 29 y.o. [id number] at 7117w5d (by 8 3/7  week scan done on 07/07/2016 [facility])  who presents in consultation from [facility]   for h/o 22 week loss and low TSH noted early in pregnancy. I saw pt during her pregnancy in 2013 .  She lost a fetus after experiencing ROM at 22 weeks, developing chorio  and passing the fetus . Her next 2 pregnancies she received 17 _P and delivered at term.  She has worked at [employer] and has been experiencing bad morning sickness . She reports that the smells have really bothered her. Despite this she has gained 10 lbs already. She denies any heat intolerance , tremulousness or palpitations during the pregnancy. Her TSH was low at [facility].    Past Medical History: Patient  has a past medical history of Depression.  Past Surgical History: She  has no past surgical history on file.  Obstetric History:  2001 TAB 2003 sab  200?- 22w loss Woke up in a pool of fluid  2010 17 P VAVD at 38 weeks 8 lbs 6 oz girl  2013 17 P SVD at 40 weeks 8l 6 oz girl - her birthday today  Gynecologic History:  Patient's last menstrual period was 05/12/2015 (approximate).    Medications-  Current outpatient prescriptions:  .  cyclobenzaprine (FLEXERIL) 5 MG tablet, Take 1 tablet (5 mg total) by mouth every 8 (eight) hours as needed for muscle spasms., Disp: 30 tablet, Rfl: 0 .  Doxylamine-Pyridoxine 10-10 MG TBEC, Take 2 tablets by mouth at bedtime as needed (nausea/vomiting)., Disp: 30 tablet, Rfl: 0 .  metroNIDAZOLE (FLAGYL) 500 MG tablet, Take 1 tablet (500 mg total) by mouth 2 (two) times daily., Disp: 14 tablet, Rfl: 0 .  naproxen sodium (ANAPROX DS) 550 MG tablet, Take 1 tablet (550 mg total) by mouth 2 (two) times daily with a meal., Disp: 60 tablet, Rfl: 0 .  promethazine (PHENERGAN) 25 MG tablet, Take 1 tablet (25 mg total) by mouth every 6  (six) hours as needed for nausea or vomiting., Disp: 15 tablet, Rfl: 0 Pt did not report taking alleve, flexeril or flagyl currently   Allergies: Patient has No Known Allergies.  Social History: Patient  reports that she has quit smoking. She has never used smokeless tobacco. She reports that she does not drink alcohol or use illicit drugs.  Family History: family history is not on file.  Review of Systems A full 12 point review of systems was negative or as noted in the History of Present Illness.  Physical Exam: LMP 05/12/2015 (Approximate) 125/67 165 lbs  HR 78   Tired appearing AAF  Normal eyes No obvious thyromegaly No tremors  FHR pos on u/s     Asessement: 1. Hyperemesis affecting pregnancy, antepartum   pt eating better now - may be source of her low TSH 2 h/o preterm birth - recommend 3817 P again check cervix at anatomy scan and at 22 weeks - no cerclage in her term pregnancies  3 Low TSH - repeat today - may have been related to early high HCG and starvation due to N&V no overt evidence of hyperthyroidism 4 chronic low back pain - wearing sensible shoes , works standing - pregnancy support belt may help need to confirm not taking daily alleve  Plan: 1 17 P weekly starting at 16  w-22 weeks  2 cervical length at anatomy scan and again at 22-24 weeks  3 check TSH 4 symptomatic care for back      Total time spent with the patient was 30 minutes with greater than 50% spent in counseling and coordination of care. We appreciate this interesting consult and will be happy to be involved in the ongoing care of [name] in anyway her obstetricians desire.  Mar Daring, MD Maternal-Fetal Medicine [facility]

## 2015-08-18 ENCOUNTER — Telehealth: Payer: Self-pay | Admitting: Obstetrics and Gynecology

## 2015-08-18 NOTE — Telephone Encounter (Signed)
  [name] elected to undergo First Trimester screening on 08/14/2015.  To review, first trimester screening, includes nuchal translucency ultrasound screen and/or first trimester maternal serum marker screening.  The nuchal translucency has approximately an 80% detection rate for Down syndrome and can be positive for other chromosome abnormalities as well as heart defects.  When combined with a maternal serum marker screening, the detection rate is up to 90% for Down syndrome and up to 97% for trisomy 13 and 18.     The results of the First Trimester Nuchal Translucency and Biochemical Screening were within normal range.  The risk for Down syndrome is now estimated to be less than 1 in 10,000.  The risk for Trisomy 13/18 is also estimated to be less than 1 in 10,000.  Should more definitive information be desired, we would offer amniocentesis.  Because we do not yet know the effectiveness of combined first and second trimester screening, we do not recommend a maternal serum screen to assess the chance for chromosome conditions.  However, if screening for neural tube defects is desired, maternal serum screening for AFP only can be performed between 15 and [redacted] weeks gestation.    Erin Andersoneborah F. Diamante Truszkowski, MS, CGC

## 2015-08-31 ENCOUNTER — Emergency Department
Admission: EM | Admit: 2015-08-31 | Discharge: 2015-08-31 | Disposition: A | Discharge status: Home / Self Care | Payer: Medicaid Other | Attending: Emergency Medicine | Admitting: Emergency Medicine

## 2015-08-31 ENCOUNTER — Encounter: Payer: Self-pay | Admitting: Emergency Medicine

## 2015-08-31 DIAGNOSIS — O9989 Other specified diseases and conditions complicating pregnancy, childbirth and the puerperium: Secondary | ICD-10-CM | POA: Diagnosis not present

## 2015-08-31 DIAGNOSIS — Z3A16 16 weeks gestation of pregnancy: Secondary | ICD-10-CM | POA: Insufficient documentation

## 2015-08-31 DIAGNOSIS — Z791 Long term (current) use of non-steroidal anti-inflammatories (NSAID): Secondary | ICD-10-CM | POA: Insufficient documentation

## 2015-08-31 DIAGNOSIS — Z79899 Other long term (current) drug therapy: Secondary | ICD-10-CM | POA: Insufficient documentation

## 2015-08-31 DIAGNOSIS — O26899 Other specified pregnancy related conditions, unspecified trimester: Secondary | ICD-10-CM

## 2015-08-31 DIAGNOSIS — R109 Unspecified abdominal pain: Secondary | ICD-10-CM | POA: Diagnosis not present

## 2015-08-31 DIAGNOSIS — R198 Other specified symptoms and signs involving the digestive system and abdomen: Secondary | ICD-10-CM | POA: Insufficient documentation

## 2015-08-31 DIAGNOSIS — Z87891 Personal history of nicotine dependence: Secondary | ICD-10-CM | POA: Diagnosis not present

## 2015-08-31 DIAGNOSIS — Z792 Long term (current) use of antibiotics: Secondary | ICD-10-CM | POA: Insufficient documentation

## 2015-08-31 LAB — CBC
HCT: 36.7 % (ref 35.0–47.0)
Hemoglobin: 12.4 g/dL (ref 12.0–16.0)
MCH: 32.2 pg (ref 26.0–34.0)
MCHC: 33.7 g/dL (ref 32.0–36.0)
MCV: 95.6 fL (ref 80.0–100.0)
PLATELETS: 354 10*3/uL (ref 150–440)
RBC: 3.84 MIL/uL (ref 3.80–5.20)
RDW: 13.1 % (ref 11.5–14.5)
WBC: 6.6 10*3/uL (ref 3.6–11.0)

## 2015-08-31 LAB — URINALYSIS COMPLETE WITH MICROSCOPIC (ARMC ONLY)
Bilirubin Urine: NEGATIVE
Glucose, UA: NEGATIVE mg/dL
HGB URINE DIPSTICK: NEGATIVE
KETONES UR: NEGATIVE mg/dL
LEUKOCYTES UA: NEGATIVE
NITRITE: NEGATIVE
PH: 7 (ref 5.0–8.0)
PROTEIN: NEGATIVE mg/dL
SPECIFIC GRAVITY, URINE: 1.017 (ref 1.005–1.030)

## 2015-08-31 LAB — CHLAMYDIA/NGC RT PCR (ARMC ONLY)
Chlamydia Tr: NOT DETECTED
N gonorrhoeae: NOT DETECTED

## 2015-08-31 LAB — WET PREP, GENITAL
CLUE CELLS WET PREP: NONE SEEN
Sperm: NONE SEEN
TRICH WET PREP: NONE SEEN
YEAST WET PREP: NONE SEEN

## 2015-08-31 LAB — HCG, QUANTITATIVE, PREGNANCY: hCG, Beta Chain, Quant, S: 35362 m[IU]/mL — ABNORMAL HIGH (ref <5)

## 2015-08-31 MED ORDER — ACETAMINOPHEN 500 MG PO TABS
1000.0000 mg | ORAL_TABLET | Freq: Once | ORAL | Status: AC
  Administered 2015-08-31: 1000 mg via ORAL
  Filled 2015-08-31: qty 2

## 2015-08-31 NOTE — ED Provider Notes (Signed)
[facility] Emergency Department Provider Note   ____________________________________________  Time seen:  I have reviewed the triage vital signs and the triage nursing note.  HISTORY  Chief Complaint Abdominal Cramping   Historian Patient  HPI Erin Curtis is a 29 y.o. female [phone] here for abdominal cramping that started yesterday and recurred today.  Cramping is noted to be midabdominal and a little bit radiating around the flanks on both sides. No vaginal bleeding. No urinary symptoms. Patient left work early today because of cramping. Denies vaginal discharge. Has had a history of STD in the past. No fever. No diarrhea. No vomiting.Patient is followed the health department for this pregnancy. She did have a stillborn at 24 weeks in the past as well as 2 normal births, as well as two abortions.  Abdominal cramping feels like uterine cramping to her and is described as mild-moderate.    Past Medical History  Diagnosis Date  . Depression     Patient Active Problem List   Diagnosis Date Noted  . First trimester screening 08/14/2015  . Hyperemesis affecting pregnancy, antepartum 08/14/2015  . Low serum thyroid stimulating hormone (TSH) 08/14/2015  . History of pregnancy loss in prior pregnancy, currently pregnant 08/14/2015  . Back pain complicating pregnancy 08/14/2015    History reviewed. No pertinent past surgical history.  Current Outpatient Rx  Name  Route  Sig  Dispense  Refill  . cyclobenzaprine (FLEXERIL) 5 MG tablet   Oral   Take 1 tablet (5 mg total) by mouth every 8 (eight) hours as needed for muscle spasms.   30 tablet   0   . Doxylamine-Pyridoxine 10-10 MG TBEC   Oral   Take 2 tablets by mouth at bedtime as needed (nausea/vomiting).   30 tablet   0   . metroNIDAZOLE (FLAGYL) 500 MG tablet   Oral   Take 1 tablet (500 mg total) by mouth 2 (two) times daily.   14 tablet   0   . naproxen sodium (ANAPROX DS) 550 MG tablet   Oral   Take 1 tablet (550 mg total) by mouth 2 (two) times daily with a meal.   60 tablet   0   . promethazine (PHENERGAN) 25 MG tablet   Oral   Take 1 tablet (25 mg total) by mouth every 6 (six) hours as needed for nausea or vomiting.   15 tablet   0     Allergies Review of patient's allergies indicates no known allergies.  No family history on file.  Social History Social History  Substance Use Topics  . Smoking status: Former Smoker -- 0.00 packs/day for 7 years  . Smokeless tobacco: Never Used  . Alcohol Use: No     Comment: socially    Review of Systems  Constitutional: Negative for fever. Eyes: Negative for visual changes. ENT: Negative for sore throat. Cardiovascular: Negative for chest pain. Respiratory: Negative for shortness of breath. Gastrointestinal: Negative for  vomiting and diarrhea. Genitourinary: Negative for dysuria. Musculoskeletal: Negative for back pain. Skin: Negative for rash. Neurological: Negative for headache. 10 point Review of Systems otherwise negative ____________________________________________   PHYSICAL EXAM:  VITAL SIGNS: ED Triage Vitals  Enc Vitals Group     BP 08/31/15 1016 110/57 mmHg     Pulse Rate 08/31/15 1016 76     Resp 08/31/15 1016 18     Temp 08/31/15 1016 98.3 F (36.8 C)     Temp Source 08/31/15 1016 Oral  SpO2 08/31/15 1016 100 %     Weight 08/31/15 1016 164 lb (74.39 kg)     Height 08/31/15 1016  (1.727 m)     Head Cir --      Peak Flow --      Pain Score 08/31/15 1007 6     Pain Loc --      Pain Edu? --      Excl. in GC? --      Constitutional: Alert and oriented. Well appearing and in no distress. Eyes: Conjunctivae are normal. PERRL. Normal extraocular movements. ENT   Head: Normocephalic and atraumatic.   Nose: No congestion/rhinnorhea.   Mouth/Throat: Mucous membranes are moist.   Neck: No stridor. Cardiovascular/Chest: Normal rate, regular rhythm.  No murmurs, rubs,  or gallops. Respiratory: Normal respiratory effort without tachypnea nor retractions. Breath sounds are clear and equal bilaterally. No wheezes/rales/rhonchi. Gastrointestinal: Soft. No distention, no guarding, no rebound. Minimal tenderness above the umbilicus. No lower abdominal tenderness palpation  Genitourinary/rectal: Small amount of white discharge. No cervicitis. Musculoskeletal: Nontender with normal range of motion in all extremities. No joint effusions.  No lower extremity tenderness.  No edema. Neurologic:  Normal speech and language. No gross or focal neurologic deficits are appreciated. Skin:  Skin is warm, dry and intact. No rash noted. Psychiatric: Mood and affect are normal. Speech and behavior are normal. Patient exhibits appropriate insight and judgment.  ____________________________________________   EKG I, Governor Rooks, MD, the attending physician have personally viewed and interpreted all ECGs.  No EKG performed ____________________________________________  LABS (pertinent positives/negatives)   Wet prep few white blood cells otherwise negative White blood cell count 6.6, hemoglobin 12.4 and platelet count 354 Quantitative hCG 35362 Urinalysis rare bacteria and otherwise negative Urine culture sent  ____________________________________________  RADIOLOGY All Xrays were viewed by me. Imaging interpreted by Radiologist.  None __________________________________________  PROCEDURES  Procedure(s) performed: Fetal heart tones: In the 140s   Critical Care performed: None  ____________________________________________   ED COURSE / ASSESSMENT AND PLAN  CONSULTATIONS: None  Pertinent labs & imaging results that were available during my care of the patient were reviewed by me and considered in my medical decision making (see chart for details).   This patient is overall well-appearing, and has reassuring laboratory studies as well as physical exam. No  high-risk features on history or physical exam here in the emergency department.    Clinically and no sign of cervicitis or PID. I am not suspicious clinically for early appendicitis or other intra-abdominal emergency. Fetal heart tones were in the 140s. There's been no vaginal bleeding.  We discussed abdominal pain precautions as well as plan for follow-up.   Patient / Family / Caregiver informed of clinical course, medical decision-making process, and agree with plan.   I discussed return precautions, follow-up instructions, and discharged instructions with patient and/or family.  ___________________________________________   FINAL CLINICAL IMPRESSION(S) / ED DIAGNOSES   Final diagnoses:  Abdominal pain in pregnancy       Governor Rooks, MD 08/31/15 1547

## 2015-08-31 NOTE — ED Notes (Signed)
Pt states cramping started yesterday - g5 p2 - considered high risk d/t have stillborn at 24 weeks. Is to start a shot this week until she is 25 weeks to help her carry the baby

## 2015-08-31 NOTE — ED Notes (Signed)
States she is 16 weeks preg. And having some abd cramping  Denies any vaginal bleeding

## 2015-08-31 NOTE — Discharge Instructions (Signed)
You were evaluated for abdominal cramping, and no certain cause was found, although your exam and evaluation are reassuring today in the emergency department. Return to the emergency department for any vaginal bleeding, or any worsening abdominal pain. Return for vomiting or fever. Follow up with your OB/GYN at the health department in the next 2-3 days for reevaluation.   Abdominal Pain, Adult Many things can cause belly (abdominal) pain. Most times, the belly pain is not dangerous. Many cases of belly pain can be watched and treated at home. HOME CARE   Do not take medicines that help you go poop (laxatives) unless told to by your doctor.  Only take medicine as told by your doctor.  Eat or drink as told by your doctor. Your doctor will tell you if you should be on a special diet. GET HELP IF:  You do not know what is causing your belly pain.  You have belly pain while you are sick to your stomach (nauseous) or have runny poop (diarrhea).  You have pain while you pee or poop.  Your belly pain wakes you up at night.  You have belly pain that gets worse or better when you eat.  You have belly pain that gets worse when you eat fatty foods.  You have a fever. GET HELP RIGHT AWAY IF:   The pain does not go away within 2 hours.  You keep throwing up (vomiting).  The pain changes and is only in the right or left part of the belly.  You have bloody or tarry looking poop. MAKE SURE YOU:   Understand these instructions.  Will watch your condition.  Will get help right away if you are not doing well or get worse.   This information is not intended to replace advice given to you by your health care provider. Make sure you discuss any questions you have with your health care provider.   Document Released: 03/01/2008 Document Revised: 10/04/2014 Document Reviewed: 05/23/2013 Elsevier Interactive Patient Education [employer]2016 Elsevier Inc.

## 2015-09-05 LAB — URINE CULTURE

## 2015-09-06 NOTE — Progress Notes (Signed)
ED cultures  Patient urine culture resulted with 80k CFU of staph aureus. Likely was a contaminate; however since patient is pregnant MD would like to treat.  Recommended Amoxil 500 mg PO TID x 7 days.  MD accepted recommendation   Spoke with [name] about calling in Rx.   RX called into [facility] on [address] [place]. Rx was taken  by Erin Curtis, PharmD  Demetrius Charityeldrin D. Sabir Charters, PharmD, BCPS

## 2015-09-18 ENCOUNTER — Ambulatory Visit: Payer: Medicaid Other

## 2015-09-18 ENCOUNTER — Ambulatory Visit
Admission: RE | Admit: 2015-09-18 | Discharge: 2015-09-18 | Disposition: A | Discharge status: Home / Self Care | Payer: Medicaid Other | Source: Ambulatory Visit | Attending: Obstetrics & Gynecology | Admitting: Obstetrics & Gynecology

## 2015-09-18 VITALS — BP 108/54 | HR 84 | Temp 97.9°F | Resp 18 | Ht 68.4 in | Wt 171.2 lb

## 2015-09-18 DIAGNOSIS — Z3A18 18 weeks gestation of pregnancy: Secondary | ICD-10-CM | POA: Diagnosis not present

## 2015-09-18 DIAGNOSIS — O09292 Supervision of pregnancy with other poor reproductive or obstetric history, second trimester: Secondary | ICD-10-CM | POA: Insufficient documentation

## 2015-09-18 DIAGNOSIS — O210 Mild hyperemesis gravidarum: Secondary | ICD-10-CM

## 2015-09-28 NOTE — L&D Delivery Note (Signed)
Delivery Note At 12:37 AM a viable female  was delivered via Vaginal, Spontaneous Delivery (Presentation: LOA;  ).  APGAR: 8, 9; weight  .   Placenta status: Intact, Spontaneous.  Cord: 3 vessels with the following complications: None.  Delayed cord clamping   Anesthesia: Local  Episiotomy: None Lacerations:  none Est. Blood Loss (mL):  100 cc  Mom to postpartum.  Baby to Couplet care / Skin to Skin.  Evangelyne Loja 01/30/2016, 12:58 AM

## 2015-09-29 ENCOUNTER — Observation Stay: Payer: Medicaid Other

## 2015-09-29 ENCOUNTER — Observation Stay
Admission: EM | Admit: 2015-09-29 | Discharge: 2015-09-30 | Disposition: A | Discharge status: Home / Self Care | Payer: Medicaid Other | Attending: Obstetrics and Gynecology | Admitting: Obstetrics and Gynecology

## 2015-09-29 DIAGNOSIS — R109 Unspecified abdominal pain: Secondary | ICD-10-CM | POA: Diagnosis present

## 2015-09-29 DIAGNOSIS — E860 Dehydration: Secondary | ICD-10-CM | POA: Insufficient documentation

## 2015-09-29 DIAGNOSIS — O26892 Other specified pregnancy related conditions, second trimester: Secondary | ICD-10-CM | POA: Diagnosis not present

## 2015-09-29 DIAGNOSIS — Z3A20 20 weeks gestation of pregnancy: Secondary | ICD-10-CM | POA: Insufficient documentation

## 2015-09-29 DIAGNOSIS — O26899 Other specified pregnancy related conditions, unspecified trimester: Secondary | ICD-10-CM

## 2015-09-29 DIAGNOSIS — F329 Major depressive disorder, single episode, unspecified: Secondary | ICD-10-CM | POA: Insufficient documentation

## 2015-09-29 LAB — COMPREHENSIVE METABOLIC PANEL
ALBUMIN: 3.3 g/dL — AB (ref 3.5–5.0)
ALK PHOS: 55 U/L (ref 38–126)
ALT: 9 U/L — AB (ref 14–54)
ANION GAP: 7 (ref 5–15)
AST: 14 U/L — ABNORMAL LOW (ref 15–41)
BUN: 9 mg/dL (ref 6–20)
CALCIUM: 8.7 mg/dL — AB (ref 8.9–10.3)
CO2: 23 mmol/L (ref 22–32)
Chloride: 106 mmol/L (ref 101–111)
Creatinine, Ser: 0.39 mg/dL — ABNORMAL LOW (ref 0.44–1.00)
GFR calc Af Amer: >60 mL/min (ref >60)
GFR calc non Af Amer: >60 mL/min (ref >60)
GLUCOSE: 130 mg/dL — AB (ref 65–99)
Potassium: 3.1 mmol/L — ABNORMAL LOW (ref 3.5–5.1)
SODIUM: 136 mmol/L (ref 135–145)
Total Bilirubin: 0.5 mg/dL (ref 0.3–1.2)
Total Protein: 6.6 g/dL (ref 6.5–8.1)

## 2015-09-29 LAB — TSH: TSH: 0.255 u[IU]/mL — ABNORMAL LOW (ref 0.350–4.500)

## 2015-09-29 LAB — URINALYSIS COMPLETE WITH MICROSCOPIC (ARMC ONLY)
Bilirubin Urine: NEGATIVE
Glucose, UA: NEGATIVE mg/dL
HGB URINE DIPSTICK: NEGATIVE
LEUKOCYTES UA: NEGATIVE
NITRITE: NEGATIVE
PROTEIN: NEGATIVE mg/dL
Specific Gravity, Urine: 1.012 (ref 1.005–1.030)
pH: 7 (ref 5.0–8.0)

## 2015-09-29 LAB — CBC WITH DIFFERENTIAL/PLATELET
BASOS ABS: 0 10*3/uL (ref 0–0.1)
BASOS PCT: 0 %
EOS ABS: 0 10*3/uL (ref 0–0.7)
Eosinophils Relative: 0 %
HCT: 33.8 % — ABNORMAL LOW (ref 35.0–47.0)
HEMOGLOBIN: 11.6 g/dL — AB (ref 12.0–16.0)
Lymphocytes Relative: 21 %
Lymphs Abs: 1.8 10*3/uL (ref 1.0–3.6)
MCH: 33 pg (ref 26.0–34.0)
MCHC: 34.3 g/dL (ref 32.0–36.0)
MCV: 96.2 fL (ref 80.0–100.0)
Monocytes Absolute: 0.4 10*3/uL (ref 0.2–0.9)
Monocytes Relative: 5 %
NEUTROS PCT: 74 %
Neutro Abs: 6.3 10*3/uL (ref 1.4–6.5)
Platelets: 326 10*3/uL (ref 150–440)
RBC: 3.52 MIL/uL — ABNORMAL LOW (ref 3.80–5.20)
RDW: 13.7 % (ref 11.5–14.5)
WBC: 8.6 10*3/uL (ref 3.6–11.0)

## 2015-09-29 LAB — ETHANOL: Alcohol, Ethyl (B): <5 mg/dL (ref <5)

## 2015-09-29 MED ORDER — DEXTROSE IN LACTATED RINGERS 5 % IV SOLN
INTRAVENOUS | Status: DC
  Administered 2015-09-29 – 2015-09-30 (×2): via INTRAVENOUS

## 2015-09-29 MED ORDER — POTASSIUM CHLORIDE CRYS ER 20 MEQ PO TBCR
20.0000 meq | EXTENDED_RELEASE_TABLET | Freq: Once | ORAL | Status: AC
  Administered 2015-09-29: 20 meq via ORAL
  Filled 2015-09-29: qty 1

## 2015-09-29 MED ORDER — HYDROCODONE-ACETAMINOPHEN 5-325 MG PO TABS
1.0000 | ORAL_TABLET | ORAL | Status: DC | PRN
  Administered 2015-09-29 – 2015-09-30 (×3): 1 via ORAL
  Filled 2015-09-29 (×6): qty 1

## 2015-09-29 MED ORDER — DEXTROSE 5 % IN LACTATED RINGERS IV BOLUS
500.0000 mL | Freq: Once | INTRAVENOUS | Status: AC
Start: 2015-09-29 — End: 2015-09-29
  Administered 2015-09-29: 500 mL via INTRAVENOUS

## 2015-09-29 NOTE — H&P (Signed)
29 [id number] at 20+2 weeks by 8+3 week u/s complaining of lower adb cramping for today . Pt fell yesterday and broke her fall . Prior h/o of a 24 week PPROM and delivery with neonatal death shortly after ( chorio) . Pt has b een on 17 P for this pregnancy . Denies ROM  LOF . No food today  PNC complicated by low TSH  eval by MFM ( follow up labs by [facility] ??) .  PMHX : depression , PPROM fetal loss,  PSHX none  Allergies NKDA  Denies recent drug or ETOH use  O; WDWN B female , smells of ETOH  124 / 52 T= 98.5  Lungs CTA  CV RRR with murmur  adb : soft +/- uterine TTP  CX : closed / 50 %  A: ABD pain , ? Ctx. H/o of fetal loss at 24 week. Uterine TTP possible early chorio .  Possible recent ETOH use  Dehydration , poor po intake today  P: PElvic u/s to eval cx length  Blood ETOH  CBC , CMP  D5LR bolus .  ua with drug screen

## 2015-09-29 NOTE — OB Triage Note (Signed)
Toco reapplied.

## 2015-09-29 NOTE — Progress Notes (Signed)
Dr schermerhorn here to access pt.

## 2015-09-29 NOTE — OB Triage Note (Signed)
Pt back to unit from US via WC.

## 2015-09-29 NOTE — OB Triage Note (Signed)
Pt presents to l/d with c/o lower back pain and rt and left groin pain. Pt has a history of 26 week loss. Pt states she fell last night over her children's toys. Back pain started this am. No medication taken before coming in. Fetal heart tones obtained. No vaginal bleeding noted. Abdomen soft. Will notify dr schermerhorn of pt's complaints

## 2015-09-29 NOTE — OB Triage Note (Signed)
Toco discontinued. Pt provided with a sandwich tray.

## 2015-09-29 NOTE — OB Triage Note (Signed)
Pt transported to US department via WC.

## 2015-09-29 NOTE — OB Triage Note (Addendum)
Dr Feliberto GottronSchermerhorn called to unit and notified of US report, labs results, and UA, pt rating her back pain a 10, no uterine activity on toco. Orders received. Order given may take pt off toco for tonight.

## 2015-09-30 DIAGNOSIS — O26892 Other specified pregnancy related conditions, second trimester: Secondary | ICD-10-CM | POA: Diagnosis not present

## 2015-09-30 LAB — URINE DRUG SCREEN, QUALITATIVE (ARMC ONLY)
Amphetamines, Ur Screen: NOT DETECTED
BARBITURATES, UR SCREEN: NOT DETECTED
Benzodiazepine, Ur Scrn: NOT DETECTED
Cannabinoid 50 Ng, Ur ~~LOC~~: POSITIVE — AB
Cocaine Metabolite,Ur ~~LOC~~: NOT DETECTED
MDMA (Ecstasy)Ur Screen: NOT DETECTED
Methadone Scn, Ur: NOT DETECTED
OPIATE, UR SCREEN: NOT DETECTED
PHENCYCLIDINE (PCP) UR S: NOT DETECTED
Tricyclic, Ur Screen: NOT DETECTED

## 2015-09-30 LAB — T4, FREE: FREE T4: 0.77 ng/dL (ref 0.61–1.12)

## 2015-09-30 MED ORDER — HYDROCODONE-ACETAMINOPHEN 5-325 MG PO TABS: 1.0000 | ORAL_TABLET | ORAL | Status: DC | PRN

## 2015-09-30 MED ORDER — ONDANSETRON HCL 4 MG PO TABS
4.0000 mg | ORAL_TABLET | Freq: Once | ORAL | Status: AC
  Administered 2015-09-30: 4 mg via ORAL
  Filled 2015-09-30: qty 1

## 2015-09-30 MED ORDER — ONDANSETRON HCL 4 MG PO TABS: 4.0000 mg | ORAL_TABLET | Freq: Three times a day (TID) | ORAL | Status: DC | PRN

## 2015-09-30 NOTE — Progress Notes (Signed)
ANTEPARTUM NOTE - Hospital Day #1  Subjective: Reports feeling okay, received pain medication for low back pain  States she is not Tolerating po intake / and having some nausea / no vomiting   Voiding QS Denies vaginal bleeding, LOF, dysuria  Good fetal movement       Objective: Vital signs: VS: Blood pressure 118/54, pulse 79, temperature 98.1 F (36.7 C), temperature source Oral, resp. rate 16, last menstrual period 05/12/2015.  Physical exam:        General appearance/behavior: calm       Heart: RRR, S1 and S2       Lungs clear, equal bilaterally       Abdomen: uterine +TTP       Extremities: no edema, no calf pain or tenderness         Fetal Assessment:  FHR: 152 bpm  TOCO uterus quiet    Assessment: 20+[redacted] weeks gestation Abd pain  FHR present    Plan:  Free T4 today EKG for chest pain  Will d/c home later today if labs WNL and tolerating POs   Dr Feliberto GottronSchermerhorn updated with patient status / plan of care

## 2015-09-30 NOTE — Discharge Instructions (Signed)
Return to ER for any increase in pain, vaginal bleeding, decrease in fetal movement, or any other concerns.

## 2015-09-30 NOTE — Final Progress Note (Signed)
Physician Final Progress Note  Patient ID: Erin Curtis MRN: [id number] DOB/AGE: 31/13/1987 31 y.o.  Admit date: 09/29/2015 Admitting provider: Boykin Nearing, MD Discharge date: 09/30/2015   Admission Diagnoses: Abdominal pain affecting pregnancy s/p fall   Discharge Diagnoses:  Active Problems:   Abdominal pain affecting pregnancy - resolving  +Marijuana use in pregnancy  Hypokalemia - received oral supplement during admission  Nausea and vomiting in pregnancy  Chronic Low back pain   Significant Findings/ Diagnostic Studies:   Single living intrauterine gestation at 20 weeks 3 days by limited fetal biometry, concordant with assigned dating. 2. No acute abnormality. Normal cervix length (3.5 cm), with no evidence of cervical funneling. Qualitatively normal amniotic fluid volume.  This exam is performed on an urgent basis and does not comprehensively evaluate fetal size, dating, or anatomy; follow-up complete OB US should be considered if further fetal assessment is warranted.   Electronically Signed  By: Ilona Sorrel M.D.  On: 09/30/2015 08:09 Results for NATAHLIA, HOGGARD (MRN [id number]) as of 09/30/2015 17:48  Ref. Range 09/29/2015 16:11 09/29/2015 17:45 09/29/2015 19:41  Sodium Latest Ref Range: 135-145 mmol/L  136   Potassium Latest Ref Range: 3.5-5.1 mmol/L  3.1 (L)   Chloride Latest Ref Range: 101-111 mmol/L  106   CO2 Latest Ref Range: 22-32 mmol/L  23   BUN Latest Ref Range: 6-20 mg/dL  9   Creatinine Latest Ref Range: 0.44-1.00 mg/dL  0.39 (L)   Calcium Latest Ref Range: 8.9-10.3 mg/dL  8.7 (L)   EGFR (Non-African Amer.) Latest Ref Range: >60 mL/min  >60   EGFR (African American) Latest Ref Range: >60 mL/min  >60   Glucose Latest Ref Range: 65-99 mg/dL  130 (H)   Anion gap Latest Ref Range: 5-15   7   Alkaline Phosphatase Latest Ref Range: 38-126 U/L  55   Albumin Latest Ref Range: 3.5-5.0 g/dL  3.3 (L)   AST Latest Ref Range: 15-41 U/L  14 (L)   ALT  Latest Ref Range: 14-54 U/L  9 (L)   Total Protein Latest Ref Range: 6.5-8.1 g/dL  6.6   Total Bilirubin Latest Ref Range: 0.3-1.2 mg/dL  0.5   WBC Latest Ref Range: 3.6-11.0 K/uL  8.6   RBC Latest Ref Range: 3.80-5.20 MIL/uL  3.52 (L)   Hemoglobin Latest Ref Range: 12.0-16.0 g/dL  11.6 (L)   HCT Latest Ref Range: 35.0-47.0 %  33.8 (L)   MCV Latest Ref Range: 80.0-100.0 fL  96.2   MCH Latest Ref Range: 26.0-34.0 pg  33.0   MCHC Latest Ref Range: 32.0-36.0 g/dL  34.3   RDW Latest Ref Range: 11.5-14.5 %  13.7   Platelets Latest Ref Range: 150-440 K/uL  326   Neutrophils Latest Units: %  74   Lymphocytes Latest Units: %  21   Monocytes Relative Latest Units: %  5   Eosinophil Latest Units: %  0   Basophil Latest Units: %  0   NEUT# Latest Ref Range: 1.4-6.5 K/uL  6.3   Lymphocyte # Latest Ref Range: 1.0-3.6 K/uL  1.8   Monocyte # Latest Ref Range: 0.2-0.9 K/uL  0.4   Eosinophils Absolute Latest Ref Range: 0-0.7 K/uL  0.0   Basophils Absolute Latest Ref Range: 0-0.1 K/uL  0.0   TSH Latest Ref Range: 0.350-4.500 uIU/mL  0.255 (L)   Free T4 Latest Ref Range: 0.61-1.12 ng/dL  0.77   Appearance Latest Ref Range: CLEAR  CLOUDY (A)    Bacteria, UA Latest  Ref Range: NONE SEEN  MANY (A)    Bilirubin Urine Latest Ref Range: NEGATIVE  NEGATIVE    Color, Urine Latest Ref Range: YELLOW  YELLOW (A)    Glucose Latest Ref Range: NEGATIVE mg/dL NEGATIVE    Hgb urine dipstick Latest Ref Range: NEGATIVE  NEGATIVE    Ketones, ur Latest Ref Range: NEGATIVE mg/dL TRACE (A)    Leukocytes, UA Latest Ref Range: NEGATIVE  NEGATIVE    Nitrite Latest Ref Range: NEGATIVE  NEGATIVE    pH Latest Ref Range: 5.0-8.0  7.0    Protein Latest Ref Range: NEGATIVE mg/dL NEGATIVE    RBC / HPF Latest Ref Range: 0-5 RBC/hpf 0-5    Specific Gravity, Urine Latest Ref Range: 1.005-1.030  1.012    Squamous Epithelial / LPF Latest Ref Range: NONE SEEN  0-5 (A)    WBC, UA Latest Ref Range: 0-5 WBC/hpf 6-30    Alcohol, Ethyl (B)  Latest Ref Range: <5 mg/dL  <5   Amphetamines, Ur Screen Latest Ref Range: NONE DETECTED  NONE DETECTED    Barbiturates, Ur Screen Latest Ref Range: NONE DETECTED  NONE DETECTED    Benzodiazepine, Ur Scrn Latest Ref Range: NONE DETECTED  NONE DETECTED    Cocaine Metabolite,Ur [place] Latest Ref Range: NONE DETECTED  NONE DETECTED    Methadone Scn, Ur Latest Ref Range: NONE DETECTED  NONE DETECTED    MDMA (Ecstasy)Ur Screen Latest Ref Range: NONE DETECTED  NONE DETECTED    Cannabinoid 50 Ng, Ur Index Latest Ref Range: NONE DETECTED  POSITIVE (A)    Opiate, Ur Screen Latest Ref Range: NONE DETECTED  NONE DETECTED    Phencyclidine (PCP) Ur S Latest Ref Range: NONE DETECTED  NONE DETECTED    Tricyclic, Ur Screen Latest Ref Range: NONE DETECTED  NONE DETECTED     Prior h/o of a 24 week PPROM and delivery with neonatal death shortly after ( chorio) . Pt has been on 17 P for this pregnancy   Discharge Condition: stable  Disposition: 01-Home or Self Care  Diet: Regular diet  Discharge Activity: No driving while on pain medication  FHR: 165 by doptone on discharge  Discharge Instructions    Discharge activity:  No Restrictions    Complete by:  As directed      Discharge diet:  No restrictions    Complete by:  As directed      Discharge instructions    Complete by:  As directed   Daily FKC's  F/U at [facility] on 10/01/15 for 17-P injection, then on Thursday 10/02/15 at [facility] for routine Ob appointment     Do not have sex or do anything that might make you have an orgasm    Complete by:  As directed      Fetal Kick Count:  Lie on our left side for one hour after a meal, and count the number of times your baby kicks.  If it is less than 5 times, get up, move around and drink some juice.  Repeat the test 30 minutes later.  If it is still less than 5 kicks in an hour, notify your doctor.    Complete by:  As directed      Notify physician for a general feeling that "something is not right"    Complete  by:  As directed      Notify physician for increase or change in vaginal discharge    Complete by:  As directed  Notify physician for intestinal cramps, with or without diarrhea, sometimes described as "gas pain"    Complete by:  As directed      Notify physician for leaking of fluid    Complete by:  As directed      Notify physician for low, dull backache, unrelieved by heat or Tylenol    Complete by:  As directed      Notify physician for menstrual like cramps    Complete by:  As directed      Notify physician for pelvic pressure    Complete by:  As directed      Notify physician for uterine contractions.  These may be painless and feel like the uterus is tightening or the baby is  "balling up"    Complete by:  As directed      Notify physician for vaginal bleeding    Complete by:  As directed      PRETERM LABOR:  Includes any of the follwing symptoms that occur between 20 - [redacted] weeks gestation.  If these symptoms are not stopped, preterm labor can result in preterm delivery, placing your baby at risk    Complete by:  As directed             Medication List    STOP taking these medications        cyclobenzaprine 5 MG tablet  Commonly known as:  FLEXERIL     Doxylamine-Pyridoxine 10-10 MG Tbec     metroNIDAZOLE 500 MG tablet  Commonly known as:  FLAGYL     naproxen sodium 550 MG tablet  Commonly known as:  ANAPROX DS     promethazine 25 MG tablet  Commonly known as:  PHENERGAN      TAKE these medications        HYDROcodone-acetaminophen 5-325 MG tablet  Commonly known as:  NORCO/VICODIN  Take 1 tablet by mouth every 4 (four) hours as needed (pain).     ondansetron 4 MG tablet  Commonly known as:  ZOFRAN  Take 1 tablet (4 mg total) by mouth every 8 (eight) hours as needed for nausea or vomiting.     prenatal multivitamin Tabs tablet  Take 1 tablet by mouth daily at 12 noon.           Follow-up Information    Call [facility].   Specialty:  Perinatology   Why:  for [facility] appointment for Thursday 10-02-2015   Contact information:   [place] [phone] [address] [place] [phone]    F/U tomorrow 10/01/15 for 17-P injection, then for [facility]  Highly recommended [facility] - pt. States she is seeing someone for substance abuse at the [facility]  Dr. Ouida Sills updated, aware, and agrees with plan of care   Signed: Darliss Cheney 09/30/2015, 2:25 PM

## 2015-10-09 ENCOUNTER — Ambulatory Visit: Payer: Medicaid Other

## 2015-10-20 ENCOUNTER — Ambulatory Visit
Admission: RE | Admit: 2015-10-20 | Discharge: 2015-10-20 | Disposition: A | Discharge status: Home / Self Care | Payer: Medicaid Other | Source: Ambulatory Visit | Attending: Obstetrics & Gynecology | Admitting: Obstetrics & Gynecology

## 2015-10-20 DIAGNOSIS — O09292 Supervision of pregnancy with other poor reproductive or obstetric history, second trimester: Secondary | ICD-10-CM | POA: Diagnosis present

## 2015-10-20 DIAGNOSIS — Z3A23 23 weeks gestation of pregnancy: Secondary | ICD-10-CM | POA: Diagnosis not present

## 2015-11-19 ENCOUNTER — Encounter: Payer: Self-pay | Admitting: Obstetrics and Gynecology

## 2015-11-19 ENCOUNTER — Observation Stay
Admission: EM | Admit: 2015-11-19 | Discharge: 2015-11-19 | Disposition: A | Discharge status: Home / Self Care | Payer: Medicaid Other | Attending: Obstetrics and Gynecology | Admitting: Obstetrics and Gynecology

## 2015-11-19 DIAGNOSIS — Z87891 Personal history of nicotine dependence: Secondary | ICD-10-CM | POA: Insufficient documentation

## 2015-11-19 DIAGNOSIS — Z3A27 27 weeks gestation of pregnancy: Secondary | ICD-10-CM | POA: Diagnosis not present

## 2015-11-19 DIAGNOSIS — O09292 Supervision of pregnancy with other poor reproductive or obstetric history, second trimester: Principal | ICD-10-CM | POA: Insufficient documentation

## 2015-11-19 DIAGNOSIS — R102 Pelvic and perineal pain: Secondary | ICD-10-CM

## 2015-11-19 DIAGNOSIS — O26899 Other specified pregnancy related conditions, unspecified trimester: Secondary | ICD-10-CM | POA: Diagnosis present

## 2015-11-19 NOTE — H&P (Signed)
Erin Curtis Kitchen HISTORY AND PHYSICAL  HISTORY OF PRESENT ILLNESS: Erin Curtis is a 30 y.o. [id number] at [redacted]w[redacted]d by LMP consistent with  8 /7 week ultrasound with a pregnancy complicated by + MJ, previous fetal loss at 24 hours tx with 17 hp injections this pregnancy.pnc at [facility] significant for ETOH use,  +MJ,  Pt missed 17 hp shot last week for having no transportation. PT here today with cc: "vag pressure" today. 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 No ROM, VB or decreased fm. No UC's noted.   She has not been having contractions and denies leakage of fluid, vaginal bleeding, or decreased fetal movement.    REVIEW OF SYSTEMS: A complete review of systems was performed and was specifically negative for headache, changes in vision, RUQ pain, shortness of breath, chest pain, lower extremity edema and dysuria.   HISTORY:  Past Medical History  Diagnosis Date  . Depression   PTSD, ETOH usage, PPROM at 24 weeks with fetal loss,   Past Surgical History  Procedure Laterality Date  . No past surgeries      No current facility-administered medications on file prior to encounter.   Current Outpatient Prescriptions on File Prior to Encounter  Medication Sig Dispense Refill  . HYDROcodone-acetaminophen (NORCO/VICODIN) 5-325 MG tablet Take 1 tablet by mouth every 4 (four) hours as needed (pain). 10 tablet 0  . ondansetron (ZOFRAN) 4 MG tablet Take 1 tablet (4 mg total) by mouth every 8 (eight) hours as needed for nausea or vomiting. 15 tablet 0  . Prenatal Vit-Fe Fumarate-FA (PRENATAL MULTIVITAMIN) TABS tablet Take 1 tablet by mouth daily at 12 noon.       No Known Allergies  OB History  Gravida Para Term Preterm AB SAB TAB Ectopic Multiple Living  0 0 0 2    # Outcome Date GA Lbr Len/2nd Weight Sex Delivery Anes PTL Lv  5 Current           4 Term 08/13/12    F Vag-Spont   Y  3 Term 06/23/09    F Vag-Spont   Y  2 Preterm 06/2004    F Vag-Spont   ND  1 SAB                Gynecologic History: History of Abnormal Pap Smear: unknown History of STI: none  Social History  Substance Use Topics  . Smoking status: Former Smoker -- 0.00 packs/day for 7 years  . Smokeless tobacco: Never Used  . Alcohol Use: No     Comment: socially    PHYSICAL EXAM: Temp:  [98.9 F (37.2 C)] 98.9 F (37.2 C) (02/22 1813) Pulse Rate:  [73] 73 (02/22 1813) BP: (120)/(64) 120/64 mmHg (02/22 1813)  GENERAL: NAD AAOx3 CHEST:CTAB no increased work of breathing CV:RRR no appreciable murmurs, rubs, gallops ABDOMEN: gravid, nontender, EFW 5pounds by [place] EXTREMITIES:  Warm and well-perfused, nontender, nonedematous, 1+DTRs 0clonus CERVIX: long/closed/post SPECULUM: not done  FHT:140 s baseline with mod variability +accelerations and no decelerations, Cat 1 strip for age appopriate  Toco: no UC's seen  DIAGNOSTIC STUDIES: No results for input(s): WBC, HGB, HCT, PLT, NA, K, CL, CO2, BUN, CREATININE, LABGLOM, GLUCOSE, CALCIUM, BILIDIR, ALKPHOS, AST, ALT, PROT, MG in the last 168 hours.  Invalid input(s): LABALB, UA   PRENATAL STUDIES: B pos, RPR NR, GC/CH neg, Antibody neg, Varicella not found, RI, GBS not done  Prenatal Labs:  MBT:  B pos; Rubella immune, Varicella immune, HIV neg, RPR neg, Hep B neg, GC/CT neg, GBS unknown, glucola WNL  Last [place] 20  wks: placenta  above the os, AF wnl, normal anatomy  ASSESSMENT AND PLAN:  1. Fetal Well being  - Fetal Tracing: reactive NST - Ultrasound: 20 wk reviewed, as above - Group B Streptococcus: unknown - Presentation: unkown confirmed by CJ  2. Routine OB: - Prenatal labs reviewed, as above - Rh pos  3.Pelvic pressure-no   Contractions:external toco in place -  Plan for rest at home tomorrow. Work note given due to pelvic pressure 4. Pt to call [facility] to get Progesterone injection tomorrow. Pt called today and was told they could not work her in.

## 2015-11-19 NOTE — Plan of Care (Signed)
Pt has ben at work today, has not had any water to drink, only sprite and OJ. States she is having a lot of pelvic pressure. caron jones,cnm aware

## 2015-11-19 NOTE — Plan of Care (Signed)
Pt seen by c jones,cnm. Cervical exam 0. Monitor strip reviewed by c jones,cnm . Ok for pt to be discharged home.

## 2015-11-19 NOTE — Discharge Instructions (Signed)
Rest at home tomorrow. Out of work x 24 hours. Go to [facility] and get the Progesterone injection tomorrow.

## 2015-11-24 ENCOUNTER — Other Ambulatory Visit: Payer: Self-pay

## 2015-11-24 ENCOUNTER — Ambulatory Visit
Admission: RE | Admit: 2015-11-24 | Discharge: 2015-11-24 | Disposition: A | Discharge status: Home / Self Care | Payer: Medicaid Other | Source: Ambulatory Visit | Attending: Maternal & Fetal Medicine | Admitting: Maternal & Fetal Medicine

## 2015-11-24 DIAGNOSIS — O09213 Supervision of pregnancy with history of pre-term labor, third trimester: Secondary | ICD-10-CM

## 2015-11-24 DIAGNOSIS — Z3A28 28 weeks gestation of pregnancy: Secondary | ICD-10-CM | POA: Diagnosis not present

## 2015-12-30 ENCOUNTER — Encounter: Payer: Medicaid Other | Attending: Advanced Practice Midwife | Admitting: Dietician

## 2015-12-30 VITALS — BP 114/60 | Ht 68.0 in | Wt 192.5 lb

## 2015-12-30 DIAGNOSIS — O24419 Gestational diabetes mellitus in pregnancy, unspecified control: Secondary | ICD-10-CM | POA: Insufficient documentation

## 2015-12-30 DIAGNOSIS — O24410 Gestational diabetes mellitus in pregnancy, diet controlled: Secondary | ICD-10-CM

## 2015-12-30 NOTE — Patient Instructions (Signed)
Read booklet on Gestational Diabetes Follow Gestational Meal Planning Guidelines-eat 3 meals/day and 3 snacks Avoid fruit juices and sweetened beverages Consider 8 oz. Of milk as 1 carbohydrate serving Limit intake of fried foods, snack foods and sweets-make healthy food choices Complete a 3 Day Food Record and bring to next appointment Check blood sugars 4 x day - before breakfast and 2 hrs after every meal and record  Bring blood sugar log to all appointments Call MD for prescription for meter strips and lancets Strips:   Accuchek Aviva Plus test strips  Lancets:  Accuchek Fastclix lancets Walk 20-30 minutes at least 5 x week if permitted by MD Next appointment    01-07-16

## 2015-12-30 NOTE — Progress Notes (Signed)
Diabetes Self-Management Education  Visit Type: First/Initial  Appt. Start Time: 1500 Appt. End Time: 1630  12/30/2015  [name], identified by name and date of birth, is a 30 y.o. female with a diagnosis of Diabetes: Gestational Diabetes.   ASSESSMENT  Blood pressure 114/60, height  (1.727 m), weight 192 lb 8 oz (87.317 kg), last menstrual period 05/12/2015. Body mass index is 29.28 kg/(m^2).  Is not testing BG's-does not have a BG meter      Diabetes Self-Management Education - 12/30/15 1617    Visit Information   Visit Type First/Initial   Initial Visit   Diabetes Type Gestational Diabetes   Health Coping   How would you rate your overall health? Good   Psychosocial Assessment   Patient Belief/Attitude about Diabetes Motivated to manage diabetes   Self-care barriers None   Patient Concerns Glycemic Control;Healthy Lifestyle  prevent complications   Preferred Learning Style Auditory;Visual   Learning Readiness Ready   What is the last grade level you completed in school? 12   Complications   How often do you check your blood sugar? 0 times/day (not testing)   Have you had a dilated eye exam in the past 12 months? No  over 1 yr ago   Have you had a dental exam in the past 12 months? No  >1 yr ago   Are you checking your feet? Yes   How many days per week are you checking your feet? 7  some swelling in feet-c/o occasionall tingling in feet and hands   Dietary Intake   Breakfast --  breakfast time varies: 5a-11a   Snack (morning) --  eats chips -snack foods daily   Lunch --  eats lunch at 3p-eats fried foods and sweets  3-4x/wk    Dinner --  eats supper 7p-8p   Snack (evening) --  eats chips, crackers with pnut butter or honey bun   Beverage(s) --  drinks fruit juice 2-3x/day; milk 4 glasses/day and water 6-7 glasses/day   Exercise   Exercise Type --  none   Patient Education   Previous Diabetes Education No   Disease state  --  discussed GDM  and treatment-watched GDM video   Nutrition management  Role of diet in the treatment of diabetes and the relationship between the three main macronutrients and blood glucose level;Food label reading, portion sizes and measuring food.;Carbohydrate counting   Physical activity and exercise  --  role of exercise in promoting BG control-encouraged to walk 20-30 min. daily if permitted by MD   Medications --  discussed med options for GDM   Monitoring Taught/evaluated SMBG meter.;Purpose and frequency of SMBG.;Taught/discussed recording of test results and interpretation of SMBG.  gave pt Accuchek Aviva Connect meter and instructed on its use. BG 125 (ac supper)   Acute complications Discussed and identified patients' treatment of hyperglycemia.   Psychosocial adjustment Role of stress on diabetes   Preconception care Pregnancy and GDM  Role of pre-pregnancy blood glucose control on the development of the fetus;Role of family planning for patients with diabetes;Reviewed with patient blood glucose goals with pregnancy  discussed importance of good BG control to promote positive outcome with pregnancy   Personal strategies to promote health Lifestyle issues that need to be addressed for better diabetes care      Individualized Plan for Diabetes Self-Management Training:   Learning Objective:  Patient will have a greater understanding of diabetes self-management. Patient education plan is to attend individual and/or group sessions  per assessed needs and concerns.   Plan:   Patient Instructions  Read booklet on Gestational Diabetes Follow Gestational Meal Planning Guidelines-eat 3 meals/day and 3 snacks Avoid fruit juices and sweetened beverages Consider 8 oz. Of milk as 1 carbohydrate serving Limit intake of fried foods, snack foods and sweets-make healthy food choices Complete a 3 Day Food Record and bring to next appointment Check blood sugars 4 x day - before breakfast and 2 hrs after every  meal and record  Bring blood sugar log to all appointments Call MD for prescription for meter strips and lancets Strips:   Accuchek Aviva Plus test strips  Lancets:  Accuchek Fastclix lancets Walk 20-30 minutes at least 5 x week if permitted by MD Next appointment    01-07-16   Expected Outcomes:   positive  Education material provided: general meal planning Guidelines, GDM video, GDM booklet   If problems or questions, patient to contact team via: [phone]  Future DSME appointment:  01-07-16

## 2015-12-31 ENCOUNTER — Observation Stay
Admission: EM | Admit: 2015-12-31 | Discharge: 2015-12-31 | Disposition: A | Discharge status: Home / Self Care | Payer: Medicaid Other | Attending: Obstetrics and Gynecology | Admitting: Obstetrics and Gynecology

## 2015-12-31 DIAGNOSIS — O4700 False labor before 37 completed weeks of gestation, unspecified trimester: Secondary | ICD-10-CM | POA: Diagnosis present

## 2015-12-31 DIAGNOSIS — M545 Low back pain: Secondary | ICD-10-CM | POA: Diagnosis not present

## 2015-12-31 DIAGNOSIS — O4703 False labor before 37 completed weeks of gestation, third trimester: Secondary | ICD-10-CM | POA: Diagnosis not present

## 2015-12-31 DIAGNOSIS — O9989 Other specified diseases and conditions complicating pregnancy, childbirth and the puerperium: Secondary | ICD-10-CM | POA: Insufficient documentation

## 2015-12-31 DIAGNOSIS — Z3A33 33 weeks gestation of pregnancy: Secondary | ICD-10-CM | POA: Insufficient documentation

## 2015-12-31 DIAGNOSIS — O479 False labor, unspecified: Secondary | ICD-10-CM | POA: Diagnosis present

## 2015-12-31 LAB — URINALYSIS COMPLETE WITH MICROSCOPIC (ARMC ONLY)
Bilirubin Urine: NEGATIVE
Glucose, UA: NEGATIVE mg/dL
Hgb urine dipstick: NEGATIVE
KETONES UR: NEGATIVE mg/dL
LEUKOCYTES UA: NEGATIVE
Nitrite: NEGATIVE
PROTEIN: NEGATIVE mg/dL
Specific Gravity, Urine: 1.011 (ref 1.005–1.030)
pH: 7 (ref 5.0–8.0)

## 2015-12-31 MED ORDER — ACETAMINOPHEN 325 MG PO TABS
650.0000 mg | ORAL_TABLET | ORAL | Status: DC | PRN
  Administered 2015-12-31: 650 mg via ORAL
  Filled 2015-12-31: qty 2

## 2015-12-31 MED ORDER — CALCIUM CARBONATE ANTACID 500 MG PO CHEW: 2.0000 | CHEWABLE_TABLET | ORAL | Status: DC | PRN

## 2015-12-31 NOTE — OB Triage Note (Signed)
Recvd to OBS 4 per wheelchair from ED.  D to gown and to bed EFM applied.  Has hx of preterm labor and take 17P injections weekly.  Started having contractions and back pain 30 minutes ago.  Discussed plan of care and oriented to room.  Agrees with POC,  Verbalized understanding.

## 2015-12-31 NOTE — Progress Notes (Signed)
Patient ID: Erin Curtis, female   DOB: 1986/03/03, 30 y.o.   MRN: [id number] Erin Curtis 1986/03/03 G5 P2 [id number] presents for LBP and some ctx . Pt get weekly 17P  noLOF , no vaginal bleeding , O;BP 110/56 mmHg  Pulse 92  Temp(Src) 98.1 F (36.7 C) (Oral)  Resp 16  Ht 5\' 8"  (1.727 m)  Wt 192 lb (87.091 kg)  BMI 29.20 kg/m2  LMP 05/12/2015 (Approximate)  Back mild paraspinal tenderness on left  ABD soft  CX closed  NSTreactive , rare ctx Labs: ua negative  A: LBP ( improved after tylenol ), no evidence of labor  Reassuring fetal monitoring P:cont to administer 17 P Precautions given

## 2015-12-31 NOTE — Discharge Summary (Signed)
  BP 110/56 mmHg  Pulse 92  Temp(Src) 98.1 F (36.7 C) (Oral)  Resp 16  Ht 5\' 8"  (1.727 m)  Wt 192 lb (87.091 kg)  BMI 29.20 kg/m2  LMP 05/12/2015 (Approximate) Patient ID: Erin Curtis, female DOB: 02-01-1986, 30 y.o. MRN: [id number] Erin Lopesameka S Kobayashi 02-01-1986 G5 P2 3548w4d presents for LBP and some ctx . Pt get weekly 17P  noLOF , no vaginal bleeding , O;BP 110/56 mmHg  Pulse 92  Temp(Src) 98.1 F (36.7 C) (Oral)  Resp 16  Ht 5\' 8"  (1.727 m)  Wt 192 lb (87.091 kg)  BMI 29.20 kg/m2  LMP 05/12/2015 (Approximate)  Back mild paraspinal tenderness on left  ABD soft  CX closed  NSTreactive , rare ctx Labs: ua negative  A: LBP ( improved after tylenol ), no evidence of labor  Reassuring fetal monitoring P:cont to administer 17 P Precautions given

## 2016-01-07 ENCOUNTER — Ambulatory Visit: Payer: Medicaid Other | Admitting: Dietician

## 2016-01-12 ENCOUNTER — Ambulatory Visit
Admission: RE | Admit: 2016-01-12 | Discharge: 2016-01-12 | Disposition: A | Discharge status: Home / Self Care | Payer: Medicaid Other | Source: Ambulatory Visit | Attending: Maternal & Fetal Medicine | Admitting: Maternal & Fetal Medicine

## 2016-01-12 ENCOUNTER — Other Ambulatory Visit: Payer: Self-pay

## 2016-01-12 DIAGNOSIS — O359XX1 Maternal care for (suspected) fetal abnormality and damage, unspecified, fetus 1: Secondary | ICD-10-CM | POA: Diagnosis not present

## 2016-01-12 DIAGNOSIS — O26893 Other specified pregnancy related conditions, third trimester: Secondary | ICD-10-CM | POA: Diagnosis not present

## 2016-01-12 DIAGNOSIS — Z3A35 35 weeks gestation of pregnancy: Secondary | ICD-10-CM | POA: Insufficient documentation

## 2016-01-15 ENCOUNTER — Encounter: Payer: Self-pay | Admitting: Dietician

## 2016-01-15 NOTE — Progress Notes (Signed)
Have not heard back from patient to reschedule missed appointment. Sent discharge letter to MD. 

## 2016-01-29 ENCOUNTER — Encounter: Payer: Self-pay | Admitting: *Deleted

## 2016-01-29 ENCOUNTER — Other Ambulatory Visit: Payer: Self-pay

## 2016-01-29 ENCOUNTER — Inpatient Hospital Stay
Admission: EM | Admit: 2016-01-29 | Discharge: 2016-01-31 | DRG: 775 | Disposition: A | Discharge status: Home / Self Care | Payer: Medicaid Other | Attending: Obstetrics and Gynecology | Admitting: Obstetrics and Gynecology

## 2016-01-29 DIAGNOSIS — O99323 Drug use complicating pregnancy, third trimester: Secondary | ICD-10-CM | POA: Diagnosis present

## 2016-01-29 DIAGNOSIS — Z87891 Personal history of nicotine dependence: Secondary | ICD-10-CM

## 2016-01-29 DIAGNOSIS — F1290 Cannabis use, unspecified, uncomplicated: Secondary | ICD-10-CM | POA: Diagnosis present

## 2016-01-29 DIAGNOSIS — O24420 Gestational diabetes mellitus in childbirth, diet controlled: Principal | ICD-10-CM | POA: Diagnosis present

## 2016-01-29 DIAGNOSIS — O24419 Gestational diabetes mellitus in pregnancy, unspecified control: Secondary | ICD-10-CM

## 2016-01-29 DIAGNOSIS — O24410 Gestational diabetes mellitus in pregnancy, diet controlled: Secondary | ICD-10-CM | POA: Diagnosis present

## 2016-01-29 DIAGNOSIS — Z3A37 37 weeks gestation of pregnancy: Secondary | ICD-10-CM

## 2016-01-29 DIAGNOSIS — IMO0001 Reserved for inherently not codable concepts without codable children: Secondary | ICD-10-CM

## 2016-01-29 LAB — RAPID HIV SCREEN (HIV 1/2 AB+AG)
HIV 1/2 ANTIBODIES: NONREACTIVE
HIV-1 P24 ANTIGEN - HIV24: NONREACTIVE

## 2016-01-29 LAB — URINE DRUG SCREEN, QUALITATIVE (ARMC ONLY)
AMPHETAMINES, UR SCREEN: NOT DETECTED
BENZODIAZEPINE, UR SCRN: NOT DETECTED
Barbiturates, Ur Screen: NOT DETECTED
CANNABINOID 50 NG, UR ~~LOC~~: POSITIVE — AB
Cocaine Metabolite,Ur ~~LOC~~: NOT DETECTED
MDMA (ECSTASY) UR SCREEN: NOT DETECTED
Methadone Scn, Ur: NOT DETECTED
Opiate, Ur Screen: NOT DETECTED
PHENCYCLIDINE (PCP) UR S: NOT DETECTED
TRICYCLIC, UR SCREEN: NOT DETECTED

## 2016-01-29 LAB — CBC
HCT: 33.4 % — ABNORMAL LOW (ref 35.0–47.0)
Hemoglobin: 11.7 g/dL — ABNORMAL LOW (ref 12.0–16.0)
MCH: 33.1 pg (ref 26.0–34.0)
MCHC: 34.8 g/dL (ref 32.0–36.0)
MCV: 95 fL (ref 80.0–100.0)
PLATELETS: 288 10*3/uL (ref 150–440)
RBC: 3.52 MIL/uL — AB (ref 3.80–5.20)
RDW: 13.1 % (ref 11.5–14.5)
WBC: 9.3 10*3/uL (ref 3.6–11.0)

## 2016-01-29 MED ORDER — ACETAMINOPHEN 325 MG PO TABS: 650.0000 mg | ORAL_TABLET | ORAL | Status: DC | PRN

## 2016-01-29 MED ORDER — OXYTOCIN 40 UNITS IN LACTATED RINGERS INFUSION - SIMPLE MED
2.5000 [IU]/h | INTRAVENOUS | Status: DC
  Filled 2016-01-29: qty 1000

## 2016-01-29 MED ORDER — OXYCODONE-ACETAMINOPHEN 5-325 MG PO TABS
1.0000 | ORAL_TABLET | ORAL | Status: DC | PRN
  Administered 2016-01-30 – 2016-01-31 (×7): 1 via ORAL
  Filled 2016-01-29 (×8): qty 1

## 2016-01-29 MED ORDER — LACTATED RINGERS IV SOLN: INTRAVENOUS | Status: DC

## 2016-01-29 MED ORDER — CITRIC ACID-SODIUM CITRATE 334-500 MG/5ML PO SOLN: 30.0000 mL | ORAL | Status: DC | PRN

## 2016-01-29 MED ORDER — LACTATED RINGERS IV SOLN: 500.0000 mL | INTRAVENOUS | Status: DC | PRN

## 2016-01-29 MED ORDER — LIDOCAINE HCL (PF) 1 % IJ SOLN: 30.0000 mL | INTRAMUSCULAR | Status: DC | PRN

## 2016-01-29 MED ORDER — OXYTOCIN BOLUS FROM INFUSION: 500.0000 mL | INTRAVENOUS | Status: DC

## 2016-01-29 MED ORDER — OXYCODONE-ACETAMINOPHEN 5-325 MG PO TABS: 2.0000 | ORAL_TABLET | ORAL | Status: DC | PRN

## 2016-01-29 MED ORDER — BUTORPHANOL TARTRATE 1 MG/ML IJ SOLN
INTRAMUSCULAR | Status: AC
  Administered 2016-01-29: 1 mg via INTRAVENOUS
  Filled 2016-01-29: qty 1

## 2016-01-29 MED ORDER — BUTORPHANOL TARTRATE 1 MG/ML IJ SOLN
1.0000 mg | INTRAMUSCULAR | Status: DC | PRN
  Administered 2016-01-29 (×2): 1 mg via INTRAVENOUS
  Filled 2016-01-29: qty 1

## 2016-01-29 MED ORDER — ONDANSETRON HCL 4 MG/2ML IJ SOLN: 4.0000 mg | Freq: Four times a day (QID) | INTRAMUSCULAR | Status: DC | PRN

## 2016-01-29 NOTE — H&P (Signed)
Erin Curtis is a 30 y.o. female presenting for regular painful ctx 8+/10. History OB History    Gravida Para Term Preterm AB TAB SAB Ectopic Multiple Living   5 3 2 1 1  0 1 0 0 2    h/o A1 GDM  H/o prior 24 week stillbirth- on 17P this pregnancy Past Medical History  Diagnosis Date  . Depression   A1GDM   Past Surgical History  Procedure Laterality Date  . No past surgeries     Family History: family history is not on file. Social History:  reports that she has quit smoking. She has never used smokeless tobacco. She reports that she does not drink alcohol or use illicit drugs. ++  Multiple + MJ drug screen in pregnancy   Prenatal Transfer Tool  Maternal Diabetes: Yes:  Diabetes Type:  Diet controlled Genetic Screening: Normal, no record of AFP  Maternal Ultrasounds/Referrals: Normal Fetal Ultrasounds or other Referrals:  None Maternal Substance Abuse:  Yes:  Type: Marijuana Significant Maternal Medications:  None Significant Maternal Lab Results:  None Other Comments:  h/o IUFD at 24 weeks , pt on 17P for h/o PTD  ROS  Exam : NST ctx q 5 minutes , FHR 150 + accels , no decels   Dilation: 3 Effacement (%): 80 Station: -2 Exam by:: Erin Curtis  AROM clear  Exam Physical Exam  PrenaLungs CTA  CV  abd gravidtal labs: ABO, Rh: --/--/B POS (10/11 1741) Antibody:   neg Rubella:  imm RPR:   NR HBsAg:   neg HIV:   NR GBS:   Neg  Assessment/Plan: Active labor, AROM . CAt 1 fetal strip H/o of MJ use  UDS ordered  If + MJ will have nursery talk to pt regarding breast feeding   Stadol  Prn , CLE offered  Erin Curtis 01/29/2016, 10:03 PM

## 2016-01-30 ENCOUNTER — Encounter: Payer: Self-pay | Admitting: *Deleted

## 2016-01-30 LAB — CBC
HCT: 32.4 % — ABNORMAL LOW (ref 35.0–47.0)
Hemoglobin: 11.5 g/dL — ABNORMAL LOW (ref 12.0–16.0)
MCH: 33.6 pg (ref 26.0–34.0)
MCHC: 35.5 g/dL (ref 32.0–36.0)
MCV: 94.4 fL (ref 80.0–100.0)
Platelets: 261 10*3/uL (ref 150–440)
RBC: 3.44 MIL/uL — ABNORMAL LOW (ref 3.80–5.20)
RDW: 13.1 % (ref 11.5–14.5)
WBC: 9.5 10*3/uL (ref 3.6–11.0)

## 2016-01-30 LAB — TYPE AND SCREEN
ABO/RH(D): B POS
ANTIBODY SCREEN: NEGATIVE

## 2016-01-30 MED ORDER — DIPHENHYDRAMINE HCL 25 MG PO CAPS
25.0000 mg | ORAL_CAPSULE | Freq: Four times a day (QID) | ORAL | Status: DC | PRN
Start: 2016-01-30 — End: 2016-01-31

## 2016-01-30 MED ORDER — SIMETHICONE 80 MG PO CHEW: 80.0000 mg | CHEWABLE_TABLET | ORAL | Status: DC | PRN

## 2016-01-30 MED ORDER — ACETAMINOPHEN 325 MG PO TABS: 650.0000 mg | ORAL_TABLET | ORAL | Status: DC | PRN

## 2016-01-30 MED ORDER — ONDANSETRON HCL 4 MG/2ML IJ SOLN: 4.0000 mg | INTRAMUSCULAR | Status: DC | PRN

## 2016-01-30 MED ORDER — IBUPROFEN 600 MG PO TABS
ORAL_TABLET | ORAL | Status: AC
  Administered 2016-01-30: 600 mg via ORAL
  Filled 2016-01-30: qty 1

## 2016-01-30 MED ORDER — COCONUT OIL OIL: 1.0000 "application " | TOPICAL_OIL | Status: DC | PRN

## 2016-01-30 MED ORDER — ZOLPIDEM TARTRATE 5 MG PO TABS: 5.0000 mg | ORAL_TABLET | Freq: Every evening | ORAL | Status: DC | PRN

## 2016-01-30 MED ORDER — DIBUCAINE 1 % RE OINT: 1.0000 "application " | TOPICAL_OINTMENT | RECTAL | Status: DC | PRN

## 2016-01-30 MED ORDER — PRENATAL MULTIVITAMIN CH
1.0000 | ORAL_TABLET | Freq: Every day | ORAL | Status: DC
  Administered 2016-01-30 – 2016-01-31 (×2): 1 via ORAL
  Filled 2016-01-30 (×3): qty 1

## 2016-01-30 MED ORDER — ONDANSETRON HCL 4 MG PO TABS: 4.0000 mg | ORAL_TABLET | ORAL | Status: DC | PRN

## 2016-01-30 MED ORDER — WITCH HAZEL-GLYCERIN EX PADS: 1.0000 "application " | MEDICATED_PAD | CUTANEOUS | Status: DC | PRN

## 2016-01-30 MED ORDER — BENZOCAINE-MENTHOL 20-0.5 % EX AERO: 1.0000 "application " | INHALATION_SPRAY | CUTANEOUS | Status: DC | PRN

## 2016-01-30 MED ORDER — FERROUS SULFATE 325 (65 FE) MG PO TABS
325.0000 mg | ORAL_TABLET | Freq: Two times a day (BID) | ORAL | Status: DC
  Administered 2016-01-30 – 2016-01-31 (×3): 325 mg via ORAL
  Filled 2016-01-30 (×4): qty 1

## 2016-01-30 MED ORDER — IBUPROFEN 600 MG PO TABS
600.0000 mg | ORAL_TABLET | Freq: Four times a day (QID) | ORAL | Status: DC
  Administered 2016-01-30 (×3): 600 mg via ORAL
  Filled 2016-01-30 (×4): qty 1

## 2016-01-30 MED ORDER — SENNOSIDES-DOCUSATE SODIUM 8.6-50 MG PO TABS: 2.0000 | ORAL_TABLET | ORAL | Status: DC

## 2016-01-30 MED ORDER — MAGNESIUM HYDROXIDE 400 MG/5ML PO SUSP: 30.0000 mL | ORAL | Status: DC | PRN

## 2016-01-30 MED ORDER — MEASLES, MUMPS & RUBELLA VAC ~~LOC~~ INJ: 0.5000 mL | INJECTION | Freq: Once | SUBCUTANEOUS | Status: DC

## 2016-01-30 MED ORDER — IBUPROFEN 600 MG PO TABS
600.0000 mg | ORAL_TABLET | Freq: Four times a day (QID) | ORAL | Status: DC
  Administered 2016-01-30: 600 mg via ORAL

## 2016-01-31 LAB — RPR: RPR: NONREACTIVE

## 2016-01-31 MED ORDER — OXYCODONE-ACETAMINOPHEN 5-325 MG PO TABS: 1.0000 | ORAL_TABLET | Freq: Four times a day (QID) | ORAL | Status: DC | PRN

## 2016-01-31 MED ORDER — IBUPROFEN 600 MG PO TABS
600.0000 mg | ORAL_TABLET | Freq: Four times a day (QID) | ORAL | Status: DC
  Administered 2016-01-31 (×2): 600 mg via ORAL
  Filled 2016-01-31 (×2): qty 1

## 2016-01-31 MED ORDER — IBUPROFEN 600 MG PO TABS: 600.0000 mg | ORAL_TABLET | Freq: Four times a day (QID) | ORAL | Status: DC

## 2016-01-31 NOTE — Progress Notes (Signed)
Prenatal records indicate that pt received TDaP vaccine during pregnancy.  Erin BowenSusan Paisley Carle Dargan, RN 01/31/2016 12:13 PM

## 2016-01-31 NOTE — Discharge Instructions (Signed)
Call your doctor for increased pain or vaginal bleeding, temperature above 100.4, depression, or concerns.  No strenuous activity or heavy lifting for 6 weeks.  No intercourse, tampons, douching, or enemas for 6 weeks.  No tub baths-showers only.  No driving for 2 weeks or while taking pain medications.  Continue prenatal vitamin and iron.    Care After Vaginal Delivery Congratulations on your new baby!!  Refer to this sheet in the next few weeks. These discharge instructions provide you with information on caring for yourself after delivery. Your caregiver may also give you specific instructions. Your treatment has been planned according to the most current medical practices available, but problems sometimes occur. Call your caregiver if you have any problems or questions after you go home.  HOME CARE INSTRUCTIONS  Take over-the-counter or prescription medicines only as directed by your caregiver or pharmacist.  Do not drink alcohol, especially if you are breastfeeding or taking medicine to relieve pain.  Do not chew or smoke tobacco.  Do not use illegal drugs.  Continue to use good perineal care. Good perineal care includes:  Wiping your perineum from front to back.  Keeping your perineum clean.  Do not use tampons or douche until your caregiver says it is okay.  Shower, wash your hair, and take tub baths as directed by your caregiver.  Wear a well-fitting bra that provides breast support.  Eat healthy foods.  Drink enough fluids to keep your urine clear or pale yellow.  Eat high-fiber foods such as whole grain cereals and breads, brown rice, beans, and fresh fruits and vegetables every day. These foods may help prevent or relieve constipation.  Follow your caregiver's recommendations regarding resumption of activities such as climbing stairs, driving, lifting, exercising, or traveling. Specifically, no driving for two weeks, so that you are comfortable reacting quickly in an  emergency.  Talk to your caregiver about resuming sexual activities. Resumption of sexual activities is dependent upon your risk of infection, your rate of healing, and your comfort and desire to resume sexual activity. Usually we recommend waiting about six weeks, or until your bleeding stops and you are interested in sex.  Try to have someone help you with your household activities and your newborn for at least a few days after you leave the hospital. Even longer is better.  Rest as much as possible. Try to rest or take a nap when your newborn is sleeping. Sleep deprivation can be very hard after delivery.  Increase your activities gradually.  Keep all of your scheduled postpartum appointments. It is very important to keep your scheduled follow-up appointments. At these appointments, your caregiver will be checking to make sure that you are healing physically and emotionally.  SEEK MEDICAL CARE IF:   You are passing large clots from your vagina.   You have a foul smelling discharge from your vagina.  You have trouble urinating.  You are urinating frequently.  You have pain when you urinate.  You have a change in your bowel movements.  You have increasing redness, pain, or swelling near your vaginal incision (episiotomy) or vaginal tear.  You have pus draining from your episiotomy or vaginal tear.  Your episiotomy or vaginal tear is separating.  You have painful, hard, or reddened breasts.  You have a severe headache.  You have blurred vision or see spots.  You feel sad or depressed.  You have thoughts of hurting yourself or your newborn.  You have questions about your care, the care  of your newborn, or medicines.  You are dizzy or light-headed.  You have a rash.  You have nausea or vomiting.  You were breastfeeding and have not had a menstrual period within 12 weeks after you stopped breastfeeding.  You are not breastfeeding and have not had a menstrual period by  the 12th week after delivery.  You have a fever.  SEEK IMMEDIATE MEDICAL CARE IF:   You have persistent pain.  You have chest pain.  You have shortness of breath.  You faint.  You have leg pain.  You have stomach pain.  Your vaginal bleeding saturates two or more sanitary pads in 1 hour.  MAKE SURE YOU:   Understand these instructions.  Will get help right away if you are not doing well or get worse.   Document Released: 09/10/2000 Document Revised: 01/28/2014 Document Reviewed: 05/10/2012  [facility] Patient Information 2015 [place], [place]. This information is not intended to replace advice given to you by your health care provider. Make sure you discuss any questions you have with your health care provider.

## 2016-01-31 NOTE — Progress Notes (Addendum)
PPD #1 SVD, baby girl Azerba[place]Ariana   S:  Reports feeling good, but still having low back pain - states she had low back pain during pregnancy             Tolerating po/ No nausea or vomiting             Bleeding is light             Pain controlled with Motrin and Percocet             Up ad lib / ambulatory / voiding QS  Newborn formula feeding   O:               VS: BP 116/68 mmHg  Pulse 125  Temp(Src) 98.4 F (36.9 C) (Oral)  Resp 18  Ht 5\' 8"  (1.727 m)  Wt 89.812 kg (198 lb)  BMI 30.11 kg/m2  SpO2 96%  LMP 05/12/2015 (Approximate)  Breastfeeding? Unknown   LABS:              Recent Labs  01/29/16 2235 01/30/16 0527  WBC 9.3 9.5  HGB 11.7* 11.5*  PLT 288 261               Blood type: --/--/B POS (05/04 2313)  Rubella:      Immune  Varicella: Immune                 I&O: Intake/Output      05/05 0701 - 05/06 0700 05/06 0701 - 05/07 0700   P.O. 240 240   I.V. (mL/kg) 333.5 (3.7)    Total Intake(mL/kg) 573.5 (6.4) 240 (2.7)   Urine (mL/kg/hr) 1100 (0.5)    Total Output 1100     Net -526.5 +240                      Physical Exam:             Alert and oriented X3  Lungs: Clear and unlabored  Heart: regular rate and rhythm / no mumurs  Abdomen: soft, non-tender, non-distended              Fundus: firm, non-tender, U-1  Perineum:intact, no significant edema or erythema  Lochia: appropriate   Extremities: no edema, no calf pain or tenderness    A: PPD # 1  Doing well - stable status  Marijuana use in pregnancy   P: Routine post partum orders  Contraception: planning BTL - wants to come back to be scheduled with Dr. Feliberto GottronSchermerhorn - BTL papers signed on 11/28/15 - need to get a faxed copy from [facility]  Breast care for formula feeding mothers   CSW consult today   Plan to discharge home this afternoon   Carlean JewsMeredith Kae Lauman, [place]

## 2016-01-31 NOTE — Clinical Social Work Maternal (Signed)
  CLINICAL SOCIAL WORK MATERNAL/CHILD NOTE  Patient Details  Name: Erin Curtis MRN: [id number] Date of Birth: [birth year]  Date:  01/31/2016  Clinical Social Worker Initiating Note:   Blima Rich, [place] [phone]) Date/ Time Initiated:  01/31/16/1423     Child's Name:    Rosalie Doctor   Legal Guardian:  Mother   Need for Interpreter:  None   Date of Referral:  01/31/16     Reason for Referral:  Current Substance Use/Substance Use During Pregnancy    Referral Source:  RN   Address:   ([address] 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 )   Education:  High school Herbalist Resources:  Medicaid   Other Resources:  Physicist, medical , [facility]   Cultural/Religious Considerations Which May Impact Care:  N/A  Strengths:  Ability to meet basic needs , Home prepared for child    Risk Factors/Current Problems:  Substance Use    Cognitive State:  Able to Concentrate , Alert    Mood/Affect:  Happy , Calm    CSW Assessment: Clinical Education officer, museum (CSW) received a consult for positive drug screen for baby. Per RN infant's urine screen is positive for marijuana. RN reported no other concerns. CSW met with mother today alone at bedside. Mother was holding infant and appeared happy. CSW introduced self and explained role of CSW department. Per mother she lives in [place] with her boyfriend Elfredia Nevins and her 49 y.o and 74 y.o daughter. Per mother her boyfriend Legrand Como is the father of the 23 y.o and infant. Per mother the father of the 61 y.o is deceased. Mother reported that she works at [employer] on [address]. in [place] and  Legrand Como works at [employer]. Per mother she has a lot of support including her mother, father and sister. Mom reported that she has a car seat and all basic needs for the baby. Per mom she has [place] and Medicaid. CSW made mother aware of baby's positive drug screen for marijuana. Per mom she used marijuana as an appetite stimulate and not throughout the whole pregnancy. Per mom she does not use other drugs or drink alcohol. Per mother she goes to substance abuse treatment through the health department in [place]. CSW made mother aware that a child protective services (CPS) report will have to be made. Mom reacted approprietly. Per mom she has been involved with [facility] CPS for 30 days and the case was closed. CSW provided education about CPS. No further needs identified. Please reconsult if future social work needs arise. CSW signing off.   CSW made a CPS report in [facility]   CSW Plan/Description:  Child Protective Service Report     Elwyn Reach 01/31/2016, 2:27 PM

## 2016-01-31 NOTE — Discharge Summary (Signed)
. Obstetric Discharge Summary   Patient ID: Erin Curtis MRN: [id number] DOB/AGE: September 01, 1986 30 y.o.   Date of Admission: 01/29/2016  Date of Discharge: 01/31/16  Admitting Diagnosis: Onset of Labor at [id number] on 01/29/16  Secondary Diagnosis: Gestational diabetes diet controlled (A1) and Marijuana use in pregnancy  Mode of Delivery: normal spontaneous vaginal delivery on 01/30/16     Discharge Diagnosis: Postpartum care following vaginal delivery, +Marijuana on UDS   Intrapartum Procedures: Atificial rupture of membranes   Post partum procedures: CSW consult   Complications: none   Brief Hospital Course  Erin Lopesameka S Dangler is a [id number] who had a SVD on 01/30/16;  for further details of this delivery, please refer to the delivery note.  Patient had an uncomplicated postpartum course.  By time of discharge on PPD#1, her pain was controlled on oral pain medications; she had appropriate lochia and was ambulating, voiding without difficulty and tolerating regular diet.  She was deemed stable for discharge to home.    Labs: CBC Latest Ref Rng 01/30/2016 01/29/2016 09/29/2015  WBC 3.6 - 11.0 K/uL 9.5 9.3 8.6  Hemoglobin 12.0 - 16.0 g/dL 11.5(L) 11.7(L) 11.6(L)  Hematocrit 35.0 - 47.0 % 32.4(L) 33.4(L) 33.8(L)  Platelets 150 - 440 K/uL 261 288 326   B POS  Physical exam:  Blood pressure 116/68, pulse 125, temperature 98.6 F (37 C), temperature source Oral, resp. rate 18, height 5\' 8"  (1.727 m), weight 89.812 kg (198 lb), last menstrual period 05/12/2015, SpO2 96 %, unknown if currently breastfeeding. General: alert and no distress Lochia: appropriate Abdomen: soft, NT Uterine Fundus: firm Extremities: No evidence of DVT seen on physical exam. No lower extremity edema.  Discharge Instructions: Per After Visit Summary. Activity: Advance as tolerated. Pelvic rest for 6 weeks.  Discharge Instructions    Call MD for:  difficulty breathing, headache or visual disturbances    Complete by:  As  directed      Call MD for:  extreme fatigue    Complete by:  As directed      Call MD for:  hives    Complete by:  As directed      Call MD for:  persistant dizziness or light-headedness    Complete by:  As directed      Call MD for:  persistant nausea and vomiting    Complete by:  As directed      Call MD for:  redness, tenderness, or signs of infection (pain, swelling, redness, odor or green/yellow discharge around incision site)    Complete by:  As directed      Call MD for:  severe uncontrolled pain    Complete by:  As directed      Call MD for:  temperature >100.4    Complete by:  As directed      Call MD for:    Complete by:  As directed   Call with worsening s/s of postpartum depression or unable to care for yourself or your baby, go to ED if you develop suicidal or homicidal thoughts or thoughts of harming your baby     Diet - low sodium heart healthy    Complete by:  As directed      Driving restriction     Complete by:  As directed   No driving while taking narcotic pain medication     Sexual acrtivity    Complete by:  As directed   No intercourse until postpartum visit in 4 weeks to schedule pre-op  for bilateral tubal ligation          Diet: Regular Medications:   Medication List    STOP taking these medications        HYDROcodone-acetaminophen 5-325 MG tablet  Commonly known as:  NORCO/VICODIN     ondansetron 4 MG tablet  Commonly known as:  ZOFRAN      TAKE these medications        ibuprofen 600 MG tablet  Commonly known as:  ADVIL,MOTRIN  Take 1 tablet (600 mg total) by mouth every 6 (six) hours.     oxyCODONE-acetaminophen 5-325 MG tablet  Commonly known as:  PERCOCET/ROXICET  Take 1-2 tablets by mouth every 6 (six) hours as needed (for pain scale greater than or equal to 4 and less than 7).     prenatal multivitamin Tabs tablet  Take 1 tablet by mouth daily at 12 noon.       Outpatient follow up:  Follow-up Information    Follow up with  SCHERMERHORN,THOMAS, MD. Schedule an appointment as soon as possible for a visit in 4 weeks.   Specialty:  Obstetrics and Gynecology   Why:  Postpartum Follow-up and Pre-op for BTL   Contact information:   [address] [place] [address] [phone]      Postpartum contraception: bilateral tubal ligation - panning to see Dr. Feliberto Gottron at 4 weeks PP to schedule 6 week PP BTL - need to request BTL papers from [facility] that were signed on 11/28/15  Discharged Condition: good  Discharged to: home   Newborn Data:  Baby Girl named "Ariana"  Disposition:home with mother  Apgars: APGAR (1 MIN): 8   APGAR (5 MINS): 9   APGAR (10 MINS):    Baby Feeding: Bottle  Karena Addison, CNM 01/31/2016

## 2016-01-31 NOTE — Progress Notes (Signed)
Discharge instructions provided.  Pt verbalizes understanding of all instructions and follow-up care.  Prescriptions given.  Pt verbalizes understanding of all instructions and follow-up care.  Pt discharged to home with infant at 1500 on 01/31/16 via wheelchair by CNA. Reynold BowenSusan Paisley Korri Ask, RN 01/31/2016 4:13 PM

## 2016-02-05 ENCOUNTER — Inpatient Hospital Stay: Admission: RE | Admit: 2016-02-05 | Payer: Medicaid Other | Source: Ambulatory Visit

## 2016-04-13 NOTE — H&P (Signed)
[name] is a 30 y.o. female here L/S BTL .G5P3 all svd . No h/o PID , no abd surgery Pt is 7weeks out from svd . Pt desires elective BTL .   Past Medical History:  has no past medical history on file.  Past Surgical History:  has no past surgical history on file. Family History: family history includes Diabetes mellitus in her father and mother; Heart disease in her father. Social History:  reports that she has been smoking.  She has been smoking about 0.25 packs per day. She does not have any smokeless tobacco history on file. She reports that she does not drink alcohol. OB/GYN History:  OB History    Gravida Para Term Preterm AB TAB SAB Ectopic Multiple Living   Allergies: has No Known Allergies. Medications: No current outpatient prescriptions on file.  Review of Systems: General:                                          No fatigue or weight loss Eyes:                                                         No vision changes Ears:                                                          No hearing difficulty Respiratory:                No cough or shortness of breath Pulmonary:                                      No asthma or shortness of breath Cardiovascular:                     No chest pain, palpitations, dyspnea on exertion Gastrointestinal:                    No abdominal bloating, chronic diarrhea, constipations, masses, pain or hematochezia Genitourinary:                                 No hematuria, dysuria, abnormal vaginal discharge, pelvic pain, Menometrorrhagia Lymphatic:                                       No swollen lymph nodes Musculoskeletal:                   No muscle weakness Neurologic:  No extremity weakness, syncope, seizure disorder Psychiatric:                                      No history of depression, delusions or suicidal/homicidal ideation    Exam:      Vitals:    03/23/16 1351  BP: 119/71  Pulse: 73    Body mass index is 26 kg/(m^2).  WDWN  black female in NAD   Lungs: CTA  CV : RRR without murmur    Neck:  no thyromegaly Abdomen: soft , no mass, normal active bowel sounds,  non-tender, no rebound tenderness Pelvic: tanner stage 5 ,  External genitalia: vulva /labia no lesions Urethra: no prolapse Vagina: normal physiologic d/c Cervix: no lesions, no cervical motion tenderness   Uterus: normal size shape and contour, non-tender Adnexa: no mass,  non-tender     Impression:   The encounter diagnosis was Encounter for general counseling and advice on contraceptive management.    Plan:  Discussed options . Pt elects for L/S BTL with falope rings . Pt is instructed to not have unprotected intercourse before surgery  Pt is aware of the failure rate of 1/300/ yr     .  Vilma PraderHOMAS JANSE Christabella Alvira, MD       Electronically signed by Vilma Praderhomas Janse Blessin Kanno, MD at 03/23/2016 2:19 PM      Office Visit on 03/23/2016      Department  Name Address Phone Fax  [facility] [address] [place] [phone]  Service Location  Name Address      [facility] [address] [place] [id number]

## 2016-04-14 ENCOUNTER — Inpatient Hospital Stay
Admission: RE | Admit: 2016-04-14 | Discharge: 2016-04-14 | Disposition: A | Discharge status: Home / Self Care | Payer: Medicaid Other | Source: Ambulatory Visit

## 2016-04-26 ENCOUNTER — Encounter: Admission: RE | Payer: Self-pay | Source: Ambulatory Visit

## 2016-04-26 ENCOUNTER — Ambulatory Visit
Admission: RE | Admit: 2016-04-26 | Payer: Medicaid Other | Source: Ambulatory Visit | Admitting: Obstetrics and Gynecology

## 2016-04-26 SURGERY — LIGATION, FALLOPIAN TUBE, LAPAROSCOPIC
Anesthesia: General | Laterality: Bilateral

## 2016-05-12 ENCOUNTER — Encounter: Payer: Self-pay | Admitting: *Deleted

## 2016-05-12 ENCOUNTER — Emergency Department
Admission: EM | Admit: 2016-05-12 | Discharge: 2016-05-12 | Disposition: A | Discharge status: Home / Self Care | Payer: Medicaid Other | Attending: Emergency Medicine | Admitting: Emergency Medicine

## 2016-05-12 ENCOUNTER — Emergency Department: Payer: Medicaid Other

## 2016-05-12 DIAGNOSIS — R0789 Other chest pain: Secondary | ICD-10-CM | POA: Insufficient documentation

## 2016-05-12 DIAGNOSIS — Z87891 Personal history of nicotine dependence: Secondary | ICD-10-CM | POA: Diagnosis not present

## 2016-05-12 DIAGNOSIS — R0602 Shortness of breath: Secondary | ICD-10-CM | POA: Diagnosis not present

## 2016-05-12 LAB — COMPREHENSIVE METABOLIC PANEL
ALT: 20 U/L (ref 14–54)
ANION GAP: 6 (ref 5–15)
AST: 22 U/L (ref 15–41)
Albumin: 4.3 g/dL (ref 3.5–5.0)
Alkaline Phosphatase: 85 U/L (ref 38–126)
BUN: 12 mg/dL (ref 6–20)
CHLORIDE: 107 mmol/L (ref 101–111)
CO2: 22 mmol/L (ref 22–32)
Calcium: 9.3 mg/dL (ref 8.9–10.3)
Creatinine, Ser: 0.7 mg/dL (ref 0.44–1.00)
GFR calc non Af Amer: >60 mL/min (ref >60)
Glucose, Bld: 94 mg/dL (ref 65–99)
POTASSIUM: 3.1 mmol/L — AB (ref 3.5–5.1)
SODIUM: 135 mmol/L (ref 135–145)
Total Bilirubin: 1 mg/dL (ref 0.3–1.2)
Total Protein: 8.3 g/dL — ABNORMAL HIGH (ref 6.5–8.1)

## 2016-05-12 LAB — TROPONIN I

## 2016-05-12 LAB — CBC
HEMATOCRIT: 39.3 % (ref 35.0–47.0)
HEMOGLOBIN: 13.4 g/dL (ref 12.0–16.0)
MCH: 31.7 pg (ref 26.0–34.0)
MCHC: 34.1 g/dL (ref 32.0–36.0)
MCV: 93 fL (ref 80.0–100.0)
PLATELETS: 368 10*3/uL (ref 150–440)
RBC: 4.22 MIL/uL (ref 3.80–5.20)
RDW: 12.9 % (ref 11.5–14.5)
WBC: 5.9 10*3/uL (ref 3.6–11.0)

## 2016-05-12 MED ORDER — GI COCKTAIL ~~LOC~~
30.0000 mL | Freq: Once | ORAL | Status: AC
  Administered 2016-05-12: 30 mL via ORAL
  Filled 2016-05-12: qty 30

## 2016-05-12 NOTE — ED Notes (Signed)
Pt provided water to drink and graham crackers to snack on. Lights dimmed for comfort.

## 2016-05-12 NOTE — Discharge Instructions (Signed)
Please seek medical attention for any high fevers, chest pain, shortness of breath, change in behavior, persistent vomiting, bloody stool or any other new or concerning symptoms.  

## 2016-05-12 NOTE — ED Provider Notes (Signed)
[facility] Emergency Department Provider Note    ____________________________________________   I have reviewed the triage vital signs and the nursing notes.   HISTORY  Chief Complaint Chest Pain   History limited by: Not Limited   HPI Erin Curtis is a 30 y.o. female who presents to the emergency department today because of concerns for chest pain. The patient states she was walking in her kitchen when she had a sudden onset of central chest pain. She describes it as pressure-like. The intense pain has receded although she continues to have a mild pain. The patient states that she has had somewhat similar pain in the past although tonight was different. Does have history of anxiety and panic attacks although has not been on her medication and has not seen her therapist for quite some time. She denies any stressful events.    Past Medical History:  Diagnosis Date  . Depression     Patient Active Problem List   Diagnosis Date Noted  . Active labor at term 01/29/2016  . Premature uterine contractions 12/31/2015  . Indication for care in labor and delivery, antepartum 11/19/2015  . Pelvic pressure in pregnancy 11/19/2015  . Abdominal pain affecting pregnancy 09/29/2015  . First trimester screening 08/14/2015  . Hyperemesis affecting pregnancy, antepartum 08/14/2015  . Low serum thyroid stimulating hormone (TSH) 08/14/2015  . History of pregnancy loss in prior pregnancy, currently pregnant 08/14/2015  . Back pain complicating pregnancy 08/14/2015    Past Surgical History:  Procedure Laterality Date  . NO PAST SURGERIES      Prior to Admission medications   Medication Sig Start Date End Date Taking? Authorizing Provider  ibuprofen (ADVIL,MOTRIN) 600 MG tablet Take 1 tablet (600 mg total) by mouth every 6 (six) hours. 01/31/16   Karena AddisonMeredith C Sigmon, CNM  oxyCODONE-acetaminophen (PERCOCET/ROXICET) 5-325 MG tablet Take 1-2 tablets by mouth every 6  (six) hours as needed (for pain scale greater than or equal to 4 and less than 7). 01/31/16   Karena AddisonMeredith C Sigmon, CNM  Prenatal Vit-Fe Fumarate-FA (PRENATAL MULTIVITAMIN) TABS tablet Take 1 tablet by mouth daily at 12 noon.    Historical Provider, MD    Allergies Review of patient's allergies indicates no known allergies.  No family history on file.  Social History Social History  Substance Use Topics  . Smoking status: Former Smoker    Packs/day: 0.00    Years: 7.00  . Smokeless tobacco: Never Used  . Alcohol use No     Comment: socially    Review of Systems  Constitutional: Negative for fever. Cardiovascular: Positive for chest pain. Respiratory: Positive for shortness of breath. Gastrointestinal: Negative for abdominal pain, vomiting and diarrhea. Neurological: Negative for headaches, focal weakness or numbness.  10-point ROS otherwise negative.  ____________________________________________   PHYSICAL EXAM:  VITAL SIGNS: ED Triage Vitals  Enc Vitals Group     BP 05/12/16 0249 113/85     Pulse Rate 05/12/16 0249 67     Resp 05/12/16 0249 18     Temp 05/12/16 0249 98.6 F (37 C)     Temp src --      SpO2 05/12/16 0300 97 %     Weight 05/12/16 0251 170 lb (77.1 kg)     Height 05/12/16 0251 5\' 8"  (1.727 m)   Constitutional: Alert and oriented. Well appearing and in no distress. Eyes: Conjunctivae are normal. PERRL. Normal extraocular movements. ENT   Head: Normocephalic and atraumatic.   Nose: No congestion/rhinnorhea.  Mouth/Throat: Mucous membranes are moist.   Neck: No stridor. Hematological/Lymphatic/Immunilogical: No cervical lymphadenopathy. Cardiovascular: Normal rate, regular rhythm.  No murmurs, rubs, or gallops. Respiratory: Normal respiratory effort without tachypnea nor retractions. Breath sounds are clear and equal bilaterally. No wheezes/rales/rhonchi. Gastrointestinal: Soft and nontender. No distention.  Genitourinary:  Deferred Musculoskeletal: Normal range of motion in all extremities. No joint effusions.  No lower extremity tenderness nor edema. Neurologic:  Normal speech and language. No gross focal neurologic deficits are appreciated.  Skin:  Skin is warm, dry and intact. No rash noted. Psychiatric: Mood and affect are normal. Speech and behavior are normal. Patient exhibits appropriate insight and judgment.  ____________________________________________    LABS (pertinent positives/negatives)  Labs Reviewed  COMPREHENSIVE METABOLIC PANEL - Abnormal; Notable for the following:       Result Value   Potassium 3.1 (*)    Total Protein 8.3 (*)    All other components within normal limits  CBC  TROPONIN I  TROPONIN I  TROPONIN I  URINALYSIS COMPLETEWITH MICROSCOPIC ([facility] ONLY)     ____________________________________________   EKG  I, Phineas SemenGraydon Shaheen Star, attending physician, personally viewed and interpreted this EKG  EKG Time: 0249 Rate: 64 Rhythm: normal sinus rhythm Axis: normal Intervals: qtc 365 QRS: narrow ST changes: no st elevation Impression: normal ekg   ____________________________________________    RADIOLOGY  CXR IMPRESSION: No active cardiopulmonary disease.  ____________________________________________   PROCEDURES  Procedures  ____________________________________________   INITIAL IMPRESSION / ASSESSMENT AND PLAN / ED COURSE  Pertinent labs & imaging results that were available during my care of the patient were reviewed by me and considered in my medical decision making (see chart for details).  Patient presented to the emergency department today because of concerns for chest pain. She does have a history of anxiety and panic attacks. Patient was observed in the emergency department and 2 troponins were negative. At this point unclear etiology of the chest pain although I do think anxiety possibility. Given the double negative traps I think heart less  than likely. Will discharge home to follow up with primary care. No clinical history or vital sign abnormalities to suggest pulmonary embolism. ____________________________________________   FINAL CLINICAL IMPRESSION(S) / ED DIAGNOSES  Final diagnoses:  Atypical chest pain     Note: This dictation was prepared with Dragon dictation. Any transcriptional errors that result from this process are unintentional    Phineas SemenGraydon Loraina Stauffer, MD 05/12/16 [phone]

## 2016-05-12 NOTE — ED Triage Notes (Signed)
Pt brought in via ems from home with chest pain/heaviness in center of chest.  Pt reports no sob.  Hx anxiety.. Out of meds.  Pt alert.

## 2016-06-18 NOTE — H&P (Signed)
Erin Curtis is a 30 y.o. female here for Pre-op Exam . Pt was scheduled for L/S BTL and cancelled . She returns now and wants to proceed   Past Medical History:  has no past medical history on file.  Past Surgical History:  has no past surgical history on file. Family History: family history includes Diabetes mellitus in her father and mother; Heart disease in her father. Social History:  reports that she has been smoking.  She has been smoking about 0.25 packs per day. She has never used smokeless tobacco. She reports that she does not drink alcohol. OB/GYN History:  OB History    Gravida Para Term Preterm AB Living   5 4 3 1 1 3    SAB TAB Ectopic Multiple Live Births    1         Allergies: has No Known Allergies. Medications: No current outpatient prescriptions on file.  Review of Systems: General:                                          No fatigue or weight loss Eyes:                                                         No vision changes Ears:                                                          No hearing difficulty Respiratory:                No cough or shortness of breath Pulmonary:                                      No asthma or shortness of breath Cardiovascular:                     No chest pain, palpitations, dyspnea on exertion Gastrointestinal:                    No abdominal bloating, chronic diarrhea, constipations, masses, pain or hematochezia Genitourinary:                                 No hematuria, dysuria, abnormal vaginal discharge, pelvic pain, Menometrorrhagia Lymphatic:                                       No swollen lymph nodes Musculoskeletal:                   No muscle weakness Neurologic:                                      No extremity  weakness, syncope, seizure disorder Psychiatric:                                      No history of depression, delusions or suicidal/homicidal ideation    Exam:      Vitals:   06/16/16  1423  BP: 108/70  Pulse: 80    Body mass index is 27.22 kg/(m^2).  WDWN white/ black female in NAD   Lungs: CTA  CV : RRR without murmur    Neck:  no thyromegaly Abdomen: soft , no mass, normal active bowel sounds,  non-tender, no rebound tenderness Pelvic: tanner stage 5 ,  External genitalia: vulva /labia no lesions Urethra: no prolapse Vagina: normal physiologic d/c Cervix: no lesions, no cervical motion tenderness   Uterus: normal size shape and contour, non-tender Adnexa: no mass,  non-tender     Impression:   The encounter diagnosis was Encounter for general counseling and advice on contraceptive management.    Plan:   L/s BTL  Benefits and risks to surgery: The proposed benefit of the surgery has been discussed with the patient. The possible risks include, but are not limited to: organ injury to the bowel , bladder, ureters, and major blood vessels and nerves. There is a possibility of additional surgeries resulting from these injuries. There is also the risk of blood transfusion and the need to receive blood products during or after the procedure which may rarely lead to HIV or Hepatitis C infection. There is a risk of developing a deep venous thrombosis or a pulmonary embolism . There is the possibility of wound infection and also anesthetic complications, even the rare possibility of death. The patient understands these risks and wishes to proceed. All questions have been answered and the consent has been signed.

## 2016-06-25 ENCOUNTER — Inpatient Hospital Stay: Admission: RE | Admit: 2016-06-25 | Payer: Medicaid Other | Source: Ambulatory Visit

## 2016-06-28 ENCOUNTER — Encounter: Payer: Self-pay | Admitting: *Deleted

## 2016-06-28 NOTE — Patient Instructions (Signed)
  Your procedure is scheduled on: 07-02-16 (FRIDAY) Report to Same Day Surgery 2nd floor medical mall To find out your arrival time please call [phone] between 1PM - 3PM on 07-01-16 (THURSDAY)  Remember: Instructions that are not followed completely may result in serious medical risk, up to and including death, or upon the discretion of your surgeon and anesthesiologist your surgery may need to be rescheduled.    _x___ 1. Do not eat food or drink liquids after midnight. No gum chewing or hard candies.     __x__ 2. No Alcohol for 24 hours before or after surgery.   __x__3. No Smoking for 24 prior to surgery.   ____  4. Bring all medications with you on the day of surgery if instructed.    __x__ 5. Notify your doctor if there is any change in your medical condition     (cold, fever, infections).     Do not wear jewelry, make-up, hairpins, clips or nail polish.  Do not wear lotions, powders, or perfumes. You may wear deodorant.  Do not shave 48 hours prior to surgery. Men may shave face and neck.  Do not bring valuables to the hospital.    [facility] is not responsible for any belongings or valuables.               Contacts, dentures or bridgework may not be worn into surgery.  Leave your suitcase in the car. After surgery it may be brought to your room.  For patients admitted to the hospital, discharge time is determined by your treatment team.   Patients discharged the day of surgery will not be allowed to drive home.    Please read over the following fact sheets that you were given:   [facility] Preparing for Surgery and or MRSA Information   ____ Take these medicines the morning of surgery with A SIP OF WATER:    1. NONE  2.  3.  4.  5.  6.  ____Fleets enema or Magnesium Citrate as directed.   _x___ Use CHG Soap or sage wipes as directed on instruction sheet   ____ Use inhalers on the day of surgery and bring to hospital day of surgery  ____ Stop metformin 2  days prior to surgery    ____ Take 1/2 of usual insulin dose the night before surgery and none on the morning of  surgery.   ____ Stop aspirin or coumadin, or plavix  x__ Stop Anti-inflammatories such as Advil, Aleve, Ibuprofen, Motrin, Naproxen,          Naprosyn, Goodies powders or aspirin products. Ok to take Tylenol.   ____ Stop supplements until after surgery.    ____ Bring C-Pap to the hospital.

## 2016-07-01 ENCOUNTER — Encounter
Admission: RE | Admit: 2016-07-01 | Discharge: 2016-07-01 | Disposition: A | Discharge status: Home / Self Care | Payer: Medicaid Other | Source: Ambulatory Visit | Attending: Obstetrics and Gynecology | Admitting: Obstetrics and Gynecology

## 2016-07-01 DIAGNOSIS — Z01818 Encounter for other preprocedural examination: Secondary | ICD-10-CM | POA: Insufficient documentation

## 2016-07-01 LAB — CBC
HCT: 38.5 % (ref 35.0–47.0)
HEMOGLOBIN: 13.3 g/dL (ref 12.0–16.0)
MCH: 32.1 pg (ref 26.0–34.0)
MCHC: 34.6 g/dL (ref 32.0–36.0)
MCV: 92.9 fL (ref 80.0–100.0)
PLATELETS: 303 10*3/uL (ref 150–440)
RBC: 4.15 MIL/uL (ref 3.80–5.20)
RDW: 13.3 % (ref 11.5–14.5)
WBC: 4.6 10*3/uL (ref 3.6–11.0)

## 2016-07-01 LAB — BASIC METABOLIC PANEL
ANION GAP: 9 (ref 5–15)
BUN: 19 mg/dL (ref 6–20)
CHLORIDE: 103 mmol/L (ref 101–111)
CO2: 26 mmol/L (ref 22–32)
Calcium: 9.2 mg/dL (ref 8.9–10.3)
Creatinine, Ser: 0.79 mg/dL (ref 0.44–1.00)
GFR calc non Af Amer: >60 mL/min (ref >60)
Glucose, Bld: 151 mg/dL — ABNORMAL HIGH (ref 65–99)
POTASSIUM: 3.2 mmol/L — AB (ref 3.5–5.1)
SODIUM: 138 mmol/L (ref 135–145)

## 2016-07-01 LAB — TYPE AND SCREEN
ABO/RH(D): B POS
Antibody Screen: NEGATIVE

## 2016-07-01 MED ORDER — SOD CITRATE-CITRIC ACID 500-334 MG/5ML PO SOLN: 30.0000 mL | ORAL | Status: DC

## 2016-07-01 NOTE — Pre-Procedure Instructions (Signed)
Spoke with becky-lynn at Dr Willa RoughSchermerhorns office when I called earlier about the low K+

## 2016-07-01 NOTE — Pre-Procedure Instructions (Signed)
Called Dr Schermerhorns office and notified them of pts low K+ 3.2-faxed this over to their office also with fax confirmation received

## 2016-07-02 ENCOUNTER — Encounter
Admission: RE | Disposition: A | Discharge status: Home / Self Care | Payer: Self-pay | Source: Ambulatory Visit | Attending: Obstetrics and Gynecology

## 2016-07-02 ENCOUNTER — Ambulatory Visit: Payer: Medicaid Other | Admitting: Anesthesiology

## 2016-07-02 ENCOUNTER — Encounter: Payer: Self-pay | Admitting: *Deleted

## 2016-07-02 ENCOUNTER — Ambulatory Visit
Admission: RE | Admit: 2016-07-02 | Discharge: 2016-07-02 | Disposition: A | Discharge status: Home / Self Care | Payer: Medicaid Other | Source: Ambulatory Visit | Attending: Obstetrics and Gynecology | Admitting: Obstetrics and Gynecology

## 2016-07-02 DIAGNOSIS — F17210 Nicotine dependence, cigarettes, uncomplicated: Secondary | ICD-10-CM | POA: Insufficient documentation

## 2016-07-02 DIAGNOSIS — Z302 Encounter for sterilization: Secondary | ICD-10-CM | POA: Insufficient documentation

## 2016-07-02 HISTORY — PX: LAPAROSCOPIC TUBAL LIGATION: SHX1937

## 2016-07-02 LAB — BASIC METABOLIC PANEL
Anion gap: 4 — ABNORMAL LOW (ref 5–15)
BUN: 21 mg/dL — AB (ref 6–20)
CALCIUM: 9.2 mg/dL (ref 8.9–10.3)
CHLORIDE: 105 mmol/L (ref 101–111)
CO2: 27 mmol/L (ref 22–32)
CREATININE: 0.73 mg/dL (ref 0.44–1.00)
GFR calc non Af Amer: >60 mL/min (ref >60)
Glucose, Bld: 104 mg/dL — ABNORMAL HIGH (ref 65–99)
Potassium: 3.9 mmol/L (ref 3.5–5.1)
SODIUM: 136 mmol/L (ref 135–145)

## 2016-07-02 LAB — POCT I-STAT 4, (NA,K, GLUC, HGB,HCT)
Glucose, Bld: 98 mg/dL (ref 65–99)
HCT: 40 % (ref 36.0–46.0)
Hemoglobin: 13.6 g/dL (ref 12.0–15.0)
POTASSIUM: 4 mmol/L (ref 3.5–5.1)
SODIUM: 140 mmol/L (ref 135–145)

## 2016-07-02 LAB — URINE DRUG SCREEN, QUALITATIVE (ARMC ONLY)
AMPHETAMINES, UR SCREEN: NOT DETECTED
Barbiturates, Ur Screen: NOT DETECTED
Benzodiazepine, Ur Scrn: NOT DETECTED
CANNABINOID 50 NG, UR ~~LOC~~: POSITIVE — AB
COCAINE METABOLITE, UR ~~LOC~~: NOT DETECTED
MDMA (ECSTASY) UR SCREEN: NOT DETECTED
METHADONE SCREEN, URINE: NOT DETECTED
Opiate, Ur Screen: NOT DETECTED
Phencyclidine (PCP) Ur S: NOT DETECTED
TRICYCLIC, UR SCREEN: NOT DETECTED

## 2016-07-02 LAB — POCT PREGNANCY, URINE: PREG TEST UR: NEGATIVE

## 2016-07-02 SURGERY — LIGATION, FALLOPIAN TUBE, LAPAROSCOPIC
Anesthesia: General | Laterality: Bilateral

## 2016-07-02 MED ORDER — MIDAZOLAM HCL 2 MG/2ML IJ SOLN
INTRAMUSCULAR | Status: DC | PRN
  Administered 2016-07-02: 2 mg via INTRAVENOUS

## 2016-07-02 MED ORDER — BUPIVACAINE HCL (PF) 0.5 % IJ SOLN
INTRAMUSCULAR | Status: AC
  Filled 2016-07-02: qty 30

## 2016-07-02 MED ORDER — PROPOFOL 10 MG/ML IV BOLUS
INTRAVENOUS | Status: DC | PRN
  Administered 2016-07-02: 180 mg via INTRAVENOUS

## 2016-07-02 MED ORDER — SUGAMMADEX SODIUM 200 MG/2ML IV SOLN
INTRAVENOUS | Status: DC | PRN
  Administered 2016-07-02: 160 mg via INTRAVENOUS

## 2016-07-02 MED ORDER — OXYCODONE-ACETAMINOPHEN 5-325 MG PO TABS
ORAL_TABLET | ORAL | Status: AC
  Administered 2016-07-02: 1 via ORAL
  Filled 2016-07-02: qty 1

## 2016-07-02 MED ORDER — LACTATED RINGERS IV SOLN
INTRAVENOUS | Status: DC
  Administered 2016-07-02 (×2): via INTRAVENOUS

## 2016-07-02 MED ORDER — ONDANSETRON HCL 4 MG/2ML IJ SOLN
INTRAMUSCULAR | Status: DC | PRN
  Administered 2016-07-02: 4 mg via INTRAVENOUS

## 2016-07-02 MED ORDER — FENTANYL CITRATE (PF) 100 MCG/2ML IJ SOLN
25.0000 ug | INTRAMUSCULAR | Status: DC | PRN
  Administered 2016-07-02 (×4): 25 ug via INTRAVENOUS

## 2016-07-02 MED ORDER — BUPIVACAINE HCL 0.5 % IJ SOLN
INTRAMUSCULAR | Status: DC | PRN
  Administered 2016-07-02: 8 mL

## 2016-07-02 MED ORDER — PROMETHAZINE HCL 25 MG/ML IJ SOLN: 6.2500 mg | INTRAMUSCULAR | Status: DC | PRN

## 2016-07-02 MED ORDER — FAMOTIDINE 20 MG PO TABS
ORAL_TABLET | ORAL | Status: AC
  Filled 2016-07-02: qty 1

## 2016-07-02 MED ORDER — LIDOCAINE 2% (20 MG/ML) 5 ML SYRINGE
INTRAMUSCULAR | Status: DC | PRN
  Administered 2016-07-02: 100 mg via INTRAVENOUS

## 2016-07-02 MED ORDER — SUCCINYLCHOLINE CHLORIDE 20 MG/ML IJ SOLN
INTRAMUSCULAR | Status: DC | PRN
  Administered 2016-07-02: 100 mg via INTRAVENOUS

## 2016-07-02 MED ORDER — LACTATED RINGERS IV SOLN: INTRAVENOUS | Status: DC

## 2016-07-02 MED ORDER — DEXAMETHASONE SODIUM PHOSPHATE 10 MG/ML IJ SOLN
INTRAMUSCULAR | Status: DC | PRN
  Administered 2016-07-02: 10 mg via INTRAVENOUS

## 2016-07-02 MED ORDER — FAMOTIDINE 20 MG PO TABS: 20.0000 mg | ORAL_TABLET | Freq: Once | ORAL | Status: DC

## 2016-07-02 MED ORDER — FENTANYL CITRATE (PF) 100 MCG/2ML IJ SOLN
INTRAMUSCULAR | Status: AC
  Administered 2016-07-02: 25 ug via INTRAVENOUS
  Filled 2016-07-02: qty 2

## 2016-07-02 MED ORDER — FENTANYL CITRATE (PF) 100 MCG/2ML IJ SOLN
INTRAMUSCULAR | Status: DC | PRN
  Administered 2016-07-02 (×2): 50 ug via INTRAVENOUS
  Administered 2016-07-02: 100 ug via INTRAVENOUS

## 2016-07-02 MED ORDER — PHENYLEPHRINE HCL 10 MG/ML IJ SOLN
INTRAMUSCULAR | Status: DC | PRN
  Administered 2016-07-02: 80 ug via INTRAVENOUS

## 2016-07-02 MED ORDER — MEPERIDINE HCL 25 MG/ML IJ SOLN: 6.2500 mg | INTRAMUSCULAR | Status: DC | PRN

## 2016-07-02 MED ORDER — ROCURONIUM BROMIDE 100 MG/10ML IV SOLN
INTRAVENOUS | Status: DC | PRN
  Administered 2016-07-02: 20 mg via INTRAVENOUS
  Administered 2016-07-02: 10 mg via INTRAVENOUS

## 2016-07-02 SURGICAL SUPPLY — 24 items
BLADE SURG SZ11 CARB STEEL (BLADE) ×3 IMPLANT
CATH ROBINSON RED A/P 16FR (CATHETERS) ×3 IMPLANT
CLOSURE WOUND 1/2 X4 (GAUZE/BANDAGES/DRESSINGS) ×1
CLOSURE WOUND 1/4X4 (GAUZE/BANDAGES/DRESSINGS) ×1
GLOVE BIO SURGEON STRL SZ8 (GLOVE) ×9 IMPLANT
GOWN STRL REUS W/ TWL LRG LVL3 (GOWN DISPOSABLE) ×1 IMPLANT
GOWN STRL REUS W/ TWL XL LVL3 (GOWN DISPOSABLE) ×1 IMPLANT
GOWN STRL REUS W/TWL LRG LVL3 (GOWN DISPOSABLE) ×2
GOWN STRL REUS W/TWL XL LVL3 (GOWN DISPOSABLE) ×2
KIT DISPOSABLE FALLOPE RING (Ring) ×3 IMPLANT
KIT RM TURNOVER CYSTO AR (KITS) ×3 IMPLANT
LABEL OR SOLS (LABEL) ×3 IMPLANT
NS IRRIG 500ML POUR BTL (IV SOLUTION) ×3 IMPLANT
PACK GYN LAPAROSCOPIC (MISCELLANEOUS) ×3 IMPLANT
PAD OB MATERNITY 4.3X12.25 (PERSONAL CARE ITEMS) ×3 IMPLANT
PAD PREP 24X41 OB/GYN DISP (PERSONAL CARE ITEMS) ×3 IMPLANT
STRIP CLOSURE SKIN 1/2X4 (GAUZE/BANDAGES/DRESSINGS) ×2 IMPLANT
STRIP CLOSURE SKIN 1/4X4 (GAUZE/BANDAGES/DRESSINGS) ×2 IMPLANT
SUT VIC AB 2-0 UR6 27 (SUTURE) ×3 IMPLANT
SUT VIC AB 4-0 SH 27 (SUTURE) ×2
SUT VIC AB 4-0 SH 27XANBCTRL (SUTURE) ×1 IMPLANT
SWABSTK COMLB BENZOIN TINCTURE (MISCELLANEOUS) ×3 IMPLANT
TROCAR ENDO BLADELESS 11MM (ENDOMECHANICALS) ×3 IMPLANT
TUBING INSUFFLATOR HI FLOW (MISCELLANEOUS) ×3 IMPLANT

## 2016-07-02 NOTE — Progress Notes (Signed)
Ready for surgery , NPO . All questions answered

## 2016-07-02 NOTE — Brief Op Note (Signed)
07/02/2016  12:58 PM  PATIENT:  Erin Curtis  30 y.o. female  PRE-OPERATIVE DIAGNOSIS:  Sterilization  POST-OPERATIVE DIAGNOSIS:  Sterilization  PROCEDURE:  Procedure(s): LAPAROSCOPIC TUBAL LIGATION (Bilateral), falope rings   SURGEON:  Surgeon(s) and Role:    Suzy Bouchard* Konner Saiz J Jaremy Nosal, MD - Primary  PHYSICIAN ASSISTANT: scrub tech   ASSISTANTS: none   ANESTHESIA:   general  EBL:  Total I/O In: 1000 [I.V.:1000] Out: 5 [Blood:5]  BLOOD ADMINISTERED:none  DRAINS: none   LOCAL MEDICATIONS USED:  MARCAINE     SPECIMEN:  No Specimen  DISPOSITION OF SPECIMEN:  N/A  COUNTS:  YES  TOURNIQUET:  * No tourniquets in log *  DICTATION: .Other Dictation: Dictation Number verbal  PLAN OF CARE: Discharge to home after PACU  PATIENT DISPOSITION:  PACU - hemodynamically stable.   Delay start of Pharmacological VTE agent (>24hrs) due to surgical blood loss or risk of bleeding: not applicable

## 2016-07-02 NOTE — Anesthesia Preprocedure Evaluation (Addendum)
Anesthesia Evaluation  Patient identified by MRN, date of birth, ID band Patient awake    Reviewed: Allergy & Precautions, NPO status , Patient's Chart, lab work & pertinent test results  History of Anesthesia Complications Negative for: history of anesthetic complications  Airway Mallampati: II  TM Distance: >3 FB Neck ROM: Full    Dental no notable dental hx.    Pulmonary neg sleep apnea, neg COPD, Current Smoker,    breath sounds clear to auscultation- rhonchi (-) wheezing      Cardiovascular Exercise Tolerance: Good (-) hypertension(-) CAD and (-) Past MI  Rhythm:Regular Rate:Normal - Systolic murmurs and - Diastolic murmurs    Neuro/Psych Depression negative neurological ROS     GI/Hepatic negative GI ROS, Neg liver ROS,   Endo/Other  neg diabetes (hx of gDM, not currently on medication)  Renal/GU negative Renal ROS     Musculoskeletal negative musculoskeletal ROS (+)   Abdominal (+) - obese,   Peds  Hematology negative hematology ROS (+)   Anesthesia Other Findings   Reproductive/Obstetrics                           Anesthesia Physical Anesthesia Plan  ASA: II  Anesthesia Plan: General   Post-op Pain Management:    Induction: Intravenous  Airway Management Planned: Oral ETT  Additional Equipment:   Intra-op Plan:   Post-operative Plan: Extubation in OR  Informed Consent: I have reviewed the patients History and Physical, chart, labs and discussed the procedure including the risks, benefits and alternatives for the proposed anesthesia with the patient or authorized representative who has indicated his/her understanding and acceptance.   Dental advisory given  Plan Discussed with: Anesthesiologist and CRNA  Anesthesia Plan Comments:         Anesthesia Quick Evaluation

## 2016-07-02 NOTE — Anesthesia Postprocedure Evaluation (Signed)
Anesthesia Post Note  Patient: Erin Curtis  Procedure(s) Performed: Procedure(s) (LRB): LAPAROSCOPIC TUBAL LIGATION (Bilateral)  Patient location during evaluation: PACU Anesthesia Type: General Level of consciousness: awake and alert and oriented Pain management: pain level controlled Vital Signs Assessment: post-procedure vital signs reviewed and stable Respiratory status: spontaneous breathing, nonlabored ventilation and respiratory function stable Cardiovascular status: blood pressure returned to baseline and stable Postop Assessment: no signs of nausea or vomiting Anesthetic complications: no    Last Vitals:  Vitals:   07/02/16 1358 07/02/16 1403  BP:    Pulse: 71 78  Resp: 16 20  Temp:      Last Pain:  Vitals:   07/02/16 1403  TempSrc:   PainSc: 6                  Tino Ronan

## 2016-07-02 NOTE — Discharge Instructions (Signed)

## 2016-07-02 NOTE — Progress Notes (Signed)
No bleeding noted

## 2016-07-02 NOTE — Anesthesia Procedure Notes (Signed)
Procedure Name: Intubation Date/Time: 07/02/2016 12:19 PM Performed by: Paulette BlanchPARAS, Viraat Vanpatten Pre-anesthesia Checklist: Patient identified, Patient being monitored, Timeout performed, Emergency Drugs available and Suction available Patient Re-evaluated:Patient Re-evaluated prior to inductionOxygen Delivery Method: Circle system utilized Preoxygenation: Pre-oxygenation with 100% oxygen Intubation Type: IV induction Ventilation: Mask ventilation without difficulty Laryngoscope Size: 3 and Miller Grade View: Grade I Tube type: Oral Tube size: 7.0 mm Number of attempts: 1 Airway Equipment and Method: Stylet Placement Confirmation: ETT inserted through vocal cords under direct vision,  positive ETCO2 and breath sounds checked- equal and bilateral Secured at: 21 cm Tube secured with: Tape Dental Injury: Teeth and Oropharynx as per pre-operative assessment

## 2016-07-02 NOTE — Transfer of Care (Signed)
Immediate Anesthesia Transfer of Care Note  Patient: Erin Curtis  Procedure(s) Performed: Procedure(s): LAPAROSCOPIC TUBAL LIGATION (Bilateral)  Patient Location: PACU  Anesthesia Type:General  Level of Consciousness: awake, alert  and oriented  Airway & Oxygen Therapy: Patient Spontanous Breathing and Patient connected to face mask oxygen  Post-op Assessment: Report given to RN and Post -op Vital signs reviewed and stable  Post vital signs: Reviewed and stable  Last Vitals:  Vitals:   07/02/16 0906  Pulse: (!) 52  Temp: 36.6 C    Last Pain:  Vitals:   07/02/16 0906  TempSrc: Oral         Complications: No apparent anesthesia complications

## 2016-07-05 ENCOUNTER — Encounter: Payer: Self-pay | Admitting: Obstetrics and Gynecology

## 2016-07-05 NOTE — Op Note (Signed)
[name], [name]               ACCOUNT NO.:  [id number]  MEDICAL RECORD NO.:  [id number]  LOCATION:                                 FACILITY:  PHYSICIAN:  Jennell Cornerhomas Chistina Roston, MDDATE OF BIRTH:  11/25/1985  DATE OF PROCEDURE: DATE OF DISCHARGE:                              OPERATIVE REPORT   PREOPERATIVE DIAGNOSIS:  Elective permanent sterilization.  POSTOPERATIVE DIAGNOSIS:  Elective permanent sterilization.  PROCEDURE PERFORMED:  Laparoscopic bilateral tubal ligation, Falope rings.  SURGEON:  Jennell Cornerhomas Yazid Pop, MD  ANESTHESIA:  General endotracheal.  SURGEON:  Jennell Cornerhomas Luismanuel Corman, MD  FIRST ASSISTANT:  Scrub tech.  INDICATION:  A 30 year old, gravida 5, para 4, the patient is interested in elective permanent sterilization.  The patient has been counseled regarding the failure of 1/200 per year.  DESCRIPTION OF PROCEDURE:  After adequate general endotracheal anesthesia, patient was placed in dorsal supine position, legs in the [place] stirrups.  Abdomen, perineum, and vagina were prepped and draped in normal sterile fashion.  Time-out was performed.  Straight catheterization of the bladder yielded 300 mL clear urine.  A 15 mm infraumbilical incision was made after injecting with 0.5% Marcaine. Laparoscope was advanced into the abdominal cavity under direct visualization with the Optiview cannula.  Second port site was placed 2 cm above the symphysis pubis.  The Falope ring trocar was advanced under direct visualization after injecting with Marcaine.  The right fallopian tube was identified in its entirety and was picked up at the isthmic ampullary portion of fallopian tube and a Falope ring was applied with a resulting 1.5 cm knuckle of fallopian tube.  Similar procedure was repeated on the patient's left fallopian tube after visualizing the fimbriated end.  Again, a 1.5 cm fallopian tube knuckle resulted after placing the Falope ring at the isthmic ampullary portion  of the tube. Good hemostasis was noted.  There were some filmy omental adhesions in the upper abdomen that were left in place, they were not on tension. There was good hemostasis at the end of the case.  The patient's abdomen was deflated and the infraumbilical incision was closed with a fascial layer of 2-0 Vicryl suture and both incisions were closed with interrupted 4-0 Vicryl suture.  Dermabond was placed over top.  Sponge stick was removed from the vagina.  There were no complications.  Patient was taken to recovery room in good condition.    ______________________________ Jennell Cornerhomas Rohan Juenger, MD   ______________________________ Jennell Cornerhomas Nitza Schmid, MD    TS/MEDQ  D:  07/02/2016  T:  07/03/2016  Job:  [id number]

## 2017-02-04 ENCOUNTER — Emergency Department: Payer: Medicaid Other

## 2017-02-04 ENCOUNTER — Emergency Department
Admission: EM | Admit: 2017-02-04 | Discharge: 2017-02-04 | Disposition: A | Discharge status: Home / Self Care | Payer: Medicaid Other | Attending: Emergency Medicine | Admitting: Emergency Medicine

## 2017-02-04 ENCOUNTER — Encounter: Payer: Self-pay | Admitting: *Deleted

## 2017-02-04 DIAGNOSIS — R1032 Left lower quadrant pain: Secondary | ICD-10-CM | POA: Insufficient documentation

## 2017-02-04 DIAGNOSIS — R109 Unspecified abdominal pain: Secondary | ICD-10-CM

## 2017-02-04 DIAGNOSIS — F17210 Nicotine dependence, cigarettes, uncomplicated: Secondary | ICD-10-CM | POA: Diagnosis not present

## 2017-02-04 DIAGNOSIS — R102 Pelvic and perineal pain: Secondary | ICD-10-CM | POA: Insufficient documentation

## 2017-02-04 LAB — COMPREHENSIVE METABOLIC PANEL
ALT: 42 U/L (ref 14–54)
ANION GAP: 8 (ref 5–15)
AST: 45 U/L — ABNORMAL HIGH (ref 15–41)
Albumin: 4.3 g/dL (ref 3.5–5.0)
Alkaline Phosphatase: 89 U/L (ref 38–126)
BUN: 18 mg/dL (ref 6–20)
CO2: 24 mmol/L (ref 22–32)
CREATININE: 0.69 mg/dL (ref 0.44–1.00)
Calcium: 9.2 mg/dL (ref 8.9–10.3)
Chloride: 108 mmol/L (ref 101–111)
Glucose, Bld: 80 mg/dL (ref 65–99)
POTASSIUM: 3.6 mmol/L (ref 3.5–5.1)
SODIUM: 140 mmol/L (ref 135–145)
Total Bilirubin: 0.5 mg/dL (ref 0.3–1.2)
Total Protein: 8 g/dL (ref 6.5–8.1)

## 2017-02-04 LAB — URINALYSIS, COMPLETE (UACMP) WITH MICROSCOPIC
BACTERIA UA: NONE SEEN
BILIRUBIN URINE: NEGATIVE
Glucose, UA: NEGATIVE mg/dL
Hgb urine dipstick: NEGATIVE
KETONES UR: NEGATIVE mg/dL
LEUKOCYTES UA: NEGATIVE
Nitrite: NEGATIVE
PH: 6 (ref 5.0–8.0)
Protein, ur: NEGATIVE mg/dL
Specific Gravity, Urine: 1.02 (ref 1.005–1.030)

## 2017-02-04 LAB — CBC
HEMATOCRIT: 38.9 % (ref 35.0–47.0)
HEMOGLOBIN: 13.4 g/dL (ref 12.0–16.0)
MCH: 31.9 pg (ref 26.0–34.0)
MCHC: 34.3 g/dL (ref 32.0–36.0)
MCV: 93 fL (ref 80.0–100.0)
PLATELETS: 329 10*3/uL (ref 150–440)
RBC: 4.18 MIL/uL (ref 3.80–5.20)
RDW: 13.2 % (ref 11.5–14.5)
WBC: 3.7 10*3/uL (ref 3.6–11.0)

## 2017-02-04 LAB — PREGNANCY, URINE: Preg Test, Ur: NEGATIVE

## 2017-02-04 LAB — LIPASE, BLOOD: Lipase: 17 U/L (ref 11–51)

## 2017-02-04 MED ORDER — KETOROLAC TROMETHAMINE 30 MG/ML IJ SOLN
30.0000 mg | Freq: Once | INTRAMUSCULAR | Status: AC
  Administered 2017-02-04: 30 mg via INTRAVENOUS
  Filled 2017-02-04: qty 1

## 2017-02-04 MED ORDER — TRAMADOL HCL 50 MG PO TABS: 50.0000 mg | ORAL_TABLET | Freq: Four times a day (QID) | ORAL | 0 refills | Status: DC | PRN

## 2017-02-04 NOTE — ED Notes (Signed)
Patient taken to ultrasound.

## 2017-02-04 NOTE — ED Triage Notes (Signed)
States left sided lower abd pain for 1 week that goes worse today, states nausea, last BM 5/10, denies any vaginal discharge or bleeding, denies any urinary symptoms

## 2017-02-04 NOTE — ED Provider Notes (Signed)
[facility] Emergency Department Provider Note   ____________________________________________    I have reviewed the triage vital signs and the nursing notes.   HISTORY  Chief Complaint Abdominal Pain     HPI Erin Curtis is a 31 y.o. female who presents with complaints of left flank pain. Patient reports she has had intermittent pain in the left flank which has been mild over the last week but today at work and became quite severe suddenly. She reports the pain as sharp 10 out of 10 and made her fall to her knees. It also made her nauseous. She denies dysuria or frequency. No fevers or chills. She has a history of a tubal ligation. Patient is currently menstruating. Denies vaginal discharge. It does not radiate   Past Medical History:  Diagnosis Date  . Depression     Patient Active Problem List   Diagnosis Date Noted  . Active labor at term 01/29/2016  . Premature uterine contractions 12/31/2015  . Indication for care in labor and delivery, antepartum 11/19/2015  . Pelvic pressure in pregnancy 11/19/2015  . Abdominal pain affecting pregnancy 09/29/2015  . First trimester screening 08/14/2015  . Hyperemesis affecting pregnancy, antepartum 08/14/2015  . Low serum thyroid stimulating hormone (TSH) 08/14/2015  . History of pregnancy loss in prior pregnancy, currently pregnant 08/14/2015  . Back pain complicating pregnancy 08/14/2015    Past Surgical History:  Procedure Laterality Date  . LAPAROSCOPIC TUBAL LIGATION Bilateral 07/02/2016   Procedure: LAPAROSCOPIC TUBAL LIGATION;  Surgeon: Suzy Bouchardhomas J Schermerhorn, MD;  Location: [facility] ORS;  Service: Gynecology;  Laterality: Bilateral;  . NO PAST SURGERIES      Prior to Admission medications   Medication Sig Start Date End Date Taking? Authorizing Provider  traMADol (ULTRAM) 50 MG tablet Take 1 tablet (50 mg total) by mouth every 6 (six) hours as needed. 02/04/17 02/04/18  Jene EveryKinner, Norris Bodley, MD      Allergies Vicodin [hydrocodone-acetaminophen]  History reviewed. No pertinent family history.  Social History Social History  Substance Use Topics  . Smoking status: Current Every Day Smoker    Packs/day: 0.25    Years: 9.00    Types: Cigarettes  . Smokeless tobacco: Never Used  . Alcohol use Yes     Comment: socially    Review of Systems  Constitutional: No fever/chills Eyes: No visual changes.  ENT: No sore throat. Cardiovascular: Denies chest pain. Respiratory: Denies shortness of breath. Gastrointestinal: As above Genitourinary: Negative for dysuria. Musculoskeletal: Negative for back pain. Skin: Negative for rash. Neurological: Negative for headaches   ____________________________________________   PHYSICAL EXAM:  VITAL SIGNS: ED Triage Vitals [02/04/17 1051]  Enc Vitals Group     BP 131/88     Pulse Rate 70     Resp 16     Temp 98.2 F (36.8 C)     Temp Source Oral     SpO2 100 %     Weight 150 lb (68 kg)     Height 5\' 8"  (1.727 m)     Head Circumference      Peak Flow      Pain Score 10     Pain Loc      Pain Edu?      Excl. in GC?     Constitutional: Alert and oriented. No acute distress. Pleasant and interactive Eyes: Conjunctivae are normal.   Nose: No congestion/rhinnorhea. Mouth/Throat: Mucous membranes are moist.    Cardiovascular: Normal rate, regular rhythm. Grossly normal heart sounds.  Good peripheral circulation. Respiratory: Normal respiratory effort.  No retractions. Lungs CTAB. Gastrointestinal: Soft and nontender. No distention.  Mild left CVA tenderness Genitourinary: deferred Musculoskeletal: No lower extremity tenderness nor edema.  Warm and well perfused Neurologic:  Normal speech and language. No gross focal neurologic deficits are appreciated.  Skin:  Skin is warm, dry and intact. No rash noted. Psychiatric: Mood and affect are normal. Speech and behavior are normal.  ____________________________________________    LABS (all labs ordered are listed, but only abnormal results are displayed)  Labs Reviewed  COMPREHENSIVE METABOLIC PANEL - Abnormal; Notable for the following:       Result Value   AST 45 (*)    All other components within normal limits  URINALYSIS, COMPLETE (UACMP) WITH MICROSCOPIC - Abnormal; Notable for the following:    Color, Urine YELLOW (*)    APPearance HAZY (*)    Squamous Epithelial / LPF 6-30 (*)    All other components within normal limits  LIPASE, BLOOD  CBC  PREGNANCY, URINE   ____________________________________________  EKG  None ____________________________________________  RADIOLOGY  None ____________________________________________   PROCEDURES  Procedure(s) performed: No    Critical Care performed: No ____________________________________________   INITIAL IMPRESSION / ASSESSMENT AND PLAN / ED COURSE  Pertinent labs & imaging results that were available during my care of the patient were reviewed by me and considered in my medical decision making (see chart for details).  Patient presents with left flank pain which has been intermittent but today became constant. No history of kidney stones. However this is certainly suspicious for ureterolithiasis, we will give IV Toradol after her pregnancy test is back obtain CT renal stone study and continue to evaluate.  CT scan unremarkable. US pelvis unremarkable. After Toradol patient has significant improvement in pain.  Given normal labs and imaging and patient's significant improvement in pain Feel patient is appropriate for discharge. Discussed this with her she agrees, she states she will return if any worsening of her pain. We will treat with tramadol.    ____________________________________________   FINAL CLINICAL IMPRESSION(S) / ED DIAGNOSES  Final diagnoses:  Acute left flank pain  Pelvic pain  Left lower quadrant pain      NEW MEDICATIONS STARTED DURING THIS VISIT:  New  Prescriptions   TRAMADOL (ULTRAM) 50 MG TABLET    Take 1 tablet (50 mg total) by mouth every 6 (six) hours as needed.     Note:  This document was prepared using Dragon voice recognition software and may include unintentional dictation errors.    Jene Every, MD 02/04/17 1538

## 2017-02-04 NOTE — ED Notes (Signed)
Patient is back from ultra sound.

## 2017-05-22 ENCOUNTER — Emergency Department: Payer: Medicaid Other

## 2017-05-22 ENCOUNTER — Encounter: Payer: Self-pay | Admitting: *Deleted

## 2017-05-22 ENCOUNTER — Observation Stay
Admission: EM | Admit: 2017-05-22 | Discharge: 2017-05-24 | Disposition: A | Discharge status: Home / Self Care | Payer: Medicaid Other | Attending: Internal Medicine | Admitting: Internal Medicine

## 2017-05-22 DIAGNOSIS — G8929 Other chronic pain: Secondary | ICD-10-CM | POA: Insufficient documentation

## 2017-05-22 DIAGNOSIS — M25511 Pain in right shoulder: Secondary | ICD-10-CM | POA: Diagnosis not present

## 2017-05-22 DIAGNOSIS — F329 Major depressive disorder, single episode, unspecified: Secondary | ICD-10-CM | POA: Diagnosis not present

## 2017-05-22 DIAGNOSIS — M545 Low back pain: Secondary | ICD-10-CM | POA: Insufficient documentation

## 2017-05-22 DIAGNOSIS — Z885 Allergy status to narcotic agent status: Secondary | ICD-10-CM | POA: Diagnosis not present

## 2017-05-22 DIAGNOSIS — F17210 Nicotine dependence, cigarettes, uncomplicated: Secondary | ICD-10-CM | POA: Insufficient documentation

## 2017-05-22 DIAGNOSIS — G459 Transient cerebral ischemic attack, unspecified: Secondary | ICD-10-CM | POA: Diagnosis not present

## 2017-05-22 DIAGNOSIS — R0789 Other chest pain: Secondary | ICD-10-CM | POA: Diagnosis not present

## 2017-05-22 DIAGNOSIS — Z8249 Family history of ischemic heart disease and other diseases of the circulatory system: Secondary | ICD-10-CM | POA: Insufficient documentation

## 2017-05-22 DIAGNOSIS — R29898 Other symptoms and signs involving the musculoskeletal system: Secondary | ICD-10-CM | POA: Diagnosis present

## 2017-05-22 DIAGNOSIS — M25519 Pain in unspecified shoulder: Secondary | ICD-10-CM

## 2017-05-22 DIAGNOSIS — R079 Chest pain, unspecified: Secondary | ICD-10-CM

## 2017-05-22 LAB — PREGNANCY, URINE: Preg Test, Ur: NEGATIVE

## 2017-05-22 LAB — BASIC METABOLIC PANEL
ANION GAP: 7 (ref 5–15)
BUN: 17 mg/dL (ref 6–20)
CHLORIDE: 107 mmol/L (ref 101–111)
CO2: 24 mmol/L (ref 22–32)
Calcium: 9.4 mg/dL (ref 8.9–10.3)
Creatinine, Ser: 0.7 mg/dL (ref 0.44–1.00)
GFR calc non Af Amer: >60 mL/min (ref >60)
Glucose, Bld: 89 mg/dL (ref 65–99)
Potassium: 3.8 mmol/L (ref 3.5–5.1)
Sodium: 138 mmol/L (ref 135–145)

## 2017-05-22 LAB — CBC
HCT: 41.6 % (ref 35.0–47.0)
HEMOGLOBIN: 14.3 g/dL (ref 12.0–16.0)
MCH: 32.3 pg (ref 26.0–34.0)
MCHC: 34.4 g/dL (ref 32.0–36.0)
MCV: 93.8 fL (ref 80.0–100.0)
Platelets: 320 10*3/uL (ref 150–440)
RBC: 4.44 MIL/uL (ref 3.80–5.20)
RDW: 13 % (ref 11.5–14.5)
WBC: 4.9 10*3/uL (ref 3.6–11.0)

## 2017-05-22 LAB — TROPONIN I
Troponin I: <0.03 ng/mL (ref <0.03)
Troponin I: <0.03 ng/mL (ref <0.03)
Troponin I: <0.03 ng/mL (ref <0.03)

## 2017-05-22 MED ORDER — ACETAMINOPHEN 650 MG RE SUPP: 650.0000 mg | RECTAL | Status: DC | PRN

## 2017-05-22 MED ORDER — ENOXAPARIN SODIUM 40 MG/0.4ML ~~LOC~~ SOLN
40.0000 mg | SUBCUTANEOUS | Status: DC
  Administered 2017-05-22 – 2017-05-23 (×2): 40 mg via SUBCUTANEOUS
  Filled 2017-05-22 (×2): qty 0.4

## 2017-05-22 MED ORDER — ASPIRIN 300 MG RE SUPP: 300.0000 mg | Freq: Once | RECTAL | Status: DC

## 2017-05-22 MED ORDER — STROKE: EARLY STAGES OF RECOVERY BOOK
Freq: Once | Status: AC
  Administered 2017-05-22: 17:00:00

## 2017-05-22 MED ORDER — ACETAMINOPHEN 325 MG PO TABS
650.0000 mg | ORAL_TABLET | ORAL | Status: DC | PRN
  Administered 2017-05-23: 02:00:00 650 mg via ORAL
  Filled 2017-05-22: qty 2

## 2017-05-22 MED ORDER — SODIUM CHLORIDE 0.9 % IV SOLN
INTRAVENOUS | Status: AC
  Administered 2017-05-22: 17:00:00 via INTRAVENOUS

## 2017-05-22 MED ORDER — ASPIRIN 325 MG PO TABS
325.0000 mg | ORAL_TABLET | Freq: Every day | ORAL | Status: DC
  Administered 2017-05-22 – 2017-05-24 (×3): 325 mg via ORAL
  Filled 2017-05-22 (×3): qty 1

## 2017-05-22 MED ORDER — SENNOSIDES-DOCUSATE SODIUM 8.6-50 MG PO TABS: 1.0000 | ORAL_TABLET | Freq: Every evening | ORAL | Status: DC | PRN

## 2017-05-22 MED ORDER — OXYCODONE-ACETAMINOPHEN 5-325 MG PO TABS
1.0000 | ORAL_TABLET | Freq: Four times a day (QID) | ORAL | Status: DC | PRN
  Administered 2017-05-22 – 2017-05-24 (×4): 1 via ORAL
  Filled 2017-05-22 (×4): qty 1

## 2017-05-22 MED ORDER — ACETAMINOPHEN 160 MG/5ML PO SOLN
650.0000 mg | ORAL | Status: DC | PRN
  Filled 2017-05-22: qty 20.3

## 2017-05-22 MED ORDER — IBUPROFEN 100 MG/5ML PO SUSP
600.0000 mg | Freq: Three times a day (TID) | ORAL | Status: DC | PRN
  Administered 2017-05-22: 19:00:00 600 mg via ORAL
  Filled 2017-05-22 (×2): qty 30

## 2017-05-22 MED ORDER — ASPIRIN 300 MG RE SUPP: 300.0000 mg | Freq: Every day | RECTAL | Status: DC

## 2017-05-22 NOTE — ED Triage Notes (Signed)
Patient from home via POV with complaint of chest pain since 0830. Patient states that the pain worsens when she sits up. Described as a "pressure" across the superior aspect of her chest with radiation into her right arm.Patient also states that it feels like she cannot take a deep breath.

## 2017-05-22 NOTE — ED Provider Notes (Signed)
[facility] Emergency Department Provider Note   ____________________________________________   First MD Initiated Contact with Patient 05/22/17 1335     (approximate)  I have reviewed the triage vital signs and the nursing notes.   HISTORY  Chief Complaint Chest Pain   HPI Erin Curtis is a 31 y.o. female who reports she got up and felt well and then had the 30 this morning a pressure across the upper part of her chest especially in the right side and radiating to her right arm. Her right arm feels funny and weak and somewhat tingly. She says sometimes it feels like she can't get a deep breath or she has to take a deep breath. She is not having any nausea vomiting she is not coughing or running a fever. She has had anxiety in the past.   Past Medical History:  Diagnosis Date  . Depression     Patient Active Problem List   Diagnosis Date Noted  . Active labor at term 01/29/2016  . Premature uterine contractions 12/31/2015  . Indication for care in labor and delivery, antepartum 11/19/2015  . Pelvic pressure in pregnancy 11/19/2015  . Abdominal pain affecting pregnancy 09/29/2015  . First trimester screening 08/14/2015  . Hyperemesis affecting pregnancy, antepartum 08/14/2015  . Low serum thyroid stimulating hormone (TSH) 08/14/2015  . History of pregnancy loss in prior pregnancy, currently pregnant 08/14/2015  . Back pain complicating pregnancy 08/14/2015    Past Surgical History:  Procedure Laterality Date  . LAPAROSCOPIC TUBAL LIGATION Bilateral 07/02/2016   Procedure: LAPAROSCOPIC TUBAL LIGATION;  Surgeon: Suzy Bouchard, MD;  Location: [facility] ORS;  Service: Gynecology;  Laterality: Bilateral;  . NO PAST SURGERIES      Prior to Admission medications   Medication Sig Start Date End Date Taking? Authorizing Provider  traMADol (ULTRAM) 50 MG tablet Take 1 tablet (50 mg total) by mouth every 6 (six) hours as needed. 02/04/17 02/04/18   Jene Every, MD    Allergies Vicodin [hydrocodone-acetaminophen]  No family history on file.  Social History Social History  Substance Use Topics  . Smoking status: Current Every Day Smoker    Packs/day: 0.25    Years: 9.00    Types: Cigarettes  . Smokeless tobacco: Never Used  . Alcohol use Yes     Comment: socially    Review of Systems  Constitutional: No fever/chills Eyes: No visual changes. ENT: No sore throat. Cardiovascular: See history of present illness. Respiratory: See history of present illness. Gastrointestinal: No abdominal pain.  No nausea, no vomiting.  No diarrhea.  No constipation. Genitourinary: Negative for dysuria. Musculoskeletal: Negative for back pain. Skin: Negative for rash. Neurological: See history of present illness.   ____________________________________________   PHYSICAL EXAM:  VITAL SIGNS: ED Triage Vitals  Enc Vitals Group     BP 05/22/17 1208 135/80     Pulse Rate 05/22/17 1208 89     Resp 05/22/17 1208 18     Temp 05/22/17 1208 98.7 F (37.1 C)     Temp Source 05/22/17 1208 Oral     SpO2 05/22/17 1208 99 %     Weight 05/22/17 1209 160 lb (72.6 kg)     Height 05/22/17 1209 5\' 8"  (1.727 m)     Head Circumference --      Peak Flow --      Pain Score 05/22/17 1308 8     Pain Loc --      Pain Edu? --  Excl. in GC? --     Constitutional: Alert and oriented. Well appearing and in no acute distress. Eyes: Conjunctivae are normal. . Head: Atraumatic. Nose: No congestion/rhinnorhea. Mouth/Throat: Mucous membranes are moist.  Oropharynx non-erythematous. Neck: No stridor.   Cardiovascular: Normal rate, regular rhythm. Grossly normal heart sounds.  Good peripheral circulation. Respiratory: Normal respiratory effort.  No retractions. Lungs CTAB. Gastrointestinal: Soft and nontender. No distention. No abdominal bruits. No CVA tenderness. Musculoskeletal: No lower extremity tenderness nor edema.  No joint  effusions. Neurologic:  Normal speech and language.  Skin:  Skin is warm, dry and intact. No rash noted. Psychiatric: Mood and affect are normal. Speech and behavior are normal. Cranial nerves II through XII are intact except I did not check visual fields. Motor seems possibly weak in the right hand although patient says it feels funny so maybe she is not actually squeezing hard because it feels funny I had patient's hold her handouts hands up to check palmar drift and the right arm drifted but again patient says it felt funny and not sure about that part of the exam either she then picked her arm back To her was supposed to be. There is no change in her in sensation or strength in the legs. Rapid alternating movements and hands seem to be normal bilaterally finger-nose is normal bilaterally  ____________________________________________   LABS (all labs ordered are listed, but only abnormal results are displayed)  Labs Reviewed  BASIC METABOLIC PANEL  CBC  TROPONIN I  PREGNANCY, URINE  TROPONIN I   ____________________________________________  EKG  EKG read and interpreted by me shows normal sinus rhythm rate of 67 normal axis no acute ST-T wave changes ____________________________________________  RADIOLOGY  Dg Chest 2 View  Result Date: 05/22/2017 CLINICAL DATA:  Chest pressure. EXAM: CHEST  2 VIEW COMPARISON:  05/12/2016 FINDINGS: The heart size and mediastinal contours are within normal limits. Both lungs are clear. The visualized skeletal structures are unremarkable. IMPRESSION: No active cardiopulmonary disease. Electronically Signed   By: Kennith Center M.D.   On: 05/22/2017 12:41   Ct Head Wo Contrast  Result Date: 05/22/2017 CLINICAL DATA:  Headaches for several hours EXAM: CT HEAD WITHOUT CONTRAST TECHNIQUE: Contiguous axial images were obtained from the base of the skull through the vertex without intravenous contrast. COMPARISON:  None. FINDINGS: Brain: No evidence of acute  infarction, hemorrhage, hydrocephalus, extra-axial collection or mass lesion/mass effect. Vascular: No hyperdense vessel or unexpected calcification. Skull: Normal. Negative for fracture or focal lesion. Sinuses/Orbits: No acute finding. Other: None. IMPRESSION: No acute intracranial abnormality noted. Electronically Signed   By: Alcide Clever M.D.   On: 05/22/2017 14:25    ____________________________________________   PROCEDURES  Procedure(s) performed:   Procedures  Critical Care performed:   ____________________________________________   INITIAL IMPRESSION / ASSESSMENT AND PLAN / ED COURSE  Pertinent labs & imaging results that were available during my care of the patient were reviewed by me and considered in my medical decision making (see chart for details).  ----------------------------------------- 2:06 PM on 05/22/2017 -----------------------------------------  Patient now says that she has trouble opening things with her right hand today. Things like jars etc.   discussed with Dr. Loretha Brasil on call for neurology recommends admission   ____________________________________________   FINAL CLINICAL IMPRESSION(S) / ED DIAGNOSES  Final diagnoses:  Chest pain, unspecified type  Right arm weakness      NEW MEDICATIONS STARTED DURING THIS VISIT:  New Prescriptions   No medications on file  Note:  This document was prepared using Dragon voice recognition software and may include unintentional dictation errors.    Arnaldo Natal, MD 05/22/17 [phone]

## 2017-05-22 NOTE — H&P (Signed)
[facility]   PATIENT NAME: Erin Curtis    MR#:  [id number]  DATE OF BIRTH:  1985/10/08  DATE OF ADMISSION:  05/22/2017  PRIMARY CARE PHYSICIAN: [facility]   REQUESTING/REFERRING PHYSICIAN: Juliette Alcide  CHIEF COMPLAINT:  Right hand weakness with tingling and chest pain  HISTORY OF PRESENT ILLNESS:  Erin Curtis  is a 31 y.o. female with a known history of chronic low back pain, depression, tobacco abuse disorder is presenting to the ED with a chief complaint of chest pain and right upper extremity weakness associated with tingling. Denies any headache or blurry vision. Denies any speech difficulty or swallowing problems.patient reports right-sided chest pressure radiating to the right arm. Initial troponin is negative. EKG is normal. CT head is normal.ED physician discussed with on-call neurologist who has recommended overnight observation to rule out stroke  PAST MEDICAL HISTORY:   Past Medical History:  Diagnosis Date  . Depression    Chronic low back pain PAST SURGICAL HISTOIRY:   Past Surgical History:  Procedure Laterality Date  . LAPAROSCOPIC TUBAL LIGATION Bilateral 07/02/2016   Procedure: LAPAROSCOPIC TUBAL LIGATION;  Surgeon: Suzy Bouchard, MD;  Location: [facility] ORS;  Service: Gynecology;  Laterality: Bilateral;  . NO PAST SURGERIES      SOCIAL HISTORY:   Social History  Substance Use Topics  . Smoking status: Current Every Day Smoker    Packs/day: 0.25    Years: 9.00    Types: Cigarettes  . Smokeless tobacco: Never Used  . Alcohol use Yes     Comment: socially    FAMILY HISTORY:  Hypertension runs in her family  DRUG ALLERGIES:   Allergies  Allergen Reactions  . Vicodin [Hydrocodone-Acetaminophen] Itching    REVIEW OF SYSTEMS:  CONSTITUTIONAL: No fever, fatigue or weakness.  EYES: No blurred or double vision.  EARS, NOSE, AND THROAT: No tinnitus or ear pain.   RESPIRATORY: No cough, shortness of breath, wheezing or hemoptysis.  CARDIOVASCULAR: reporting right-sided chest pain, denies orthopnea, edema.  GASTROINTESTINAL: No nausea, vomiting, diarrhea or abdominal pain.  GENITOURINARY: No dysuria, hematuria.  ENDOCRINE: No polyuria, nocturia,  HEMATOLOGY: No anemia, easy bruising or bleeding SKIN: No rash or lesion. MUSCULOSKELETAL: No joint pain or arthritis.   NEUROLOGIC: reporting right arm and han tingling, numbness, weakness. Denies any headache or blurry vision. PSYCHIATRY: No anxiety or depression.   MEDICATIONS AT HOME:   Prior to Admission medications   Not on File      VITAL SIGNS:  Blood pressure 109/61, pulse 61, temperature 98.7 F (37.1 C), temperature source Oral, resp. rate 13, height 5\' 8"  (1.727 m), weight 72.6 kg (160 lb), SpO2 99 %, unknown if currently breastfeeding.  PHYSICAL EXAMINATION:  GENERAL:  31 y.o.-year-old patient lying in the bed with no acute distress.  EYES: Pupils equal, round, reactive to light and accommodation. No scleral icterus. Extraocular muscles intact.  HEENT: Head atraumatic, normocephalic. Oropharynx and nasopharynx clear.  NECK:  Supple, no jugular venous distention. No thyroid enlargement, no tenderness.  LUNGS: Normal breath sounds bilaterally, no wheezing, rales,rhonchi or crepitation. No use of accessory muscles of respiration.  CARDIOVASCULAR: S1, S2 normal. No murmurs, rubs, or gallops. No anterior chest wall tenderness on palpation ABDOMEN: Soft, nontender, nondistended. Bowel sounds present. No organomegaly or mass.  EXTREMITIES: No pedal edema, cyanosis, or clubbing.  NEUROLOGIC: Cranial nerves II through XII are intact. Muscle strength 5/5 in all extremities except right upper extremity motor is 4 out of  5. Sensation intact. Gait not checked.  PSYCHIATRIC: The patient is alert and oriented x 3.  SKIN: No obvious rash, lesion, or ulcer.   LABORATORY PANEL:   CBC  Recent  Labs Lab 05/22/17 1212  WBC 4.9  HGB 14.3  HCT 41.6  PLT 320   ------------------------------------------------------------------------------------------------------------------  Chemistries   Recent Labs Lab 05/22/17 1212  NA 138  K 3.8  CL 107  CO2 24  GLUCOSE 89  BUN 17  CREATININE 0.70  CALCIUM 9.4   ------------------------------------------------------------------------------------------------------------------  Cardiac Enzymes  Recent Labs Lab 05/22/17 1410  TROPONINI <0.03   ------------------------------------------------------------------------------------------------------------------  RADIOLOGY:  Dg Chest 2 View  Result Date: 05/22/2017 CLINICAL DATA:  Chest pressure. EXAM: CHEST  2 VIEW COMPARISON:  05/12/2016 FINDINGS: The heart size and mediastinal contours are within normal limits. Both lungs are clear. The visualized skeletal structures are unremarkable. IMPRESSION: No active cardiopulmonary disease. Electronically Signed   By: Kennith Center M.D.   On: 05/22/2017 12:41   Ct Head Wo Contrast  Result Date: 05/22/2017 CLINICAL DATA:  Headaches for several hours EXAM: CT HEAD WITHOUT CONTRAST TECHNIQUE: Contiguous axial images were obtained from the base of the skull through the vertex without intravenous contrast. COMPARISON:  None. FINDINGS: Brain: No evidence of acute infarction, hemorrhage, hydrocephalus, extra-axial collection or mass lesion/mass effect. Vascular: No hyperdense vessel or unexpected calcification. Skull: Normal. Negative for fracture or focal lesion. Sinuses/Orbits: No acute finding. Other: None. IMPRESSION: No acute intracranial abnormality noted. Electronically Signed   By: Alcide Clever M.D.   On: 05/22/2017 14:25    EKG:   Orders placed or performed during the hospital encounter of 05/22/17  . EKG 12-Lead  . EKG 12-Lead  . ED EKG within 10 minutes  . ED EKG within 10 minutes  . ED EKG  . ED EKG    IMPRESSION AND PLAN:     [name]  is a 31 y.o. female with a known history of chronic low back pain, depression, tobacco abuse disorder is presenting to the ED with a chief complaint of chest pain and right upper extremity weakness associated with tingling. Denies any headache or blurry vision. Denies any speech difficulty or swallowing problems.patient reports right-sided chest pressure radiating to the right arm. Initial troponin is negative.    #TIA/CVA  Admit to MedSurg unit CT head is negative neuro checks Neurology is recommending to admit the patient under observation Stroke workup with MRI/MRA of the brain, carotid Dopplers and 2-D echocardiogram Bedside swallow evaluation, until then patient will be nothing by mouth PT, OT evaluation Aspirin rectal suppository while patient is nothing by mouth Check fasting lipid panel, TSH and hemoglobin A1c  #atypical chest pain Monitor patient on telemetry Initial troponin is negative, cycle biomarkers Consult cardiology if needed EKG with no acute changes in the ED  #Chronic low back pain pain meds as needed  # tobacco abuse disorder Counseled patient to quit smoking for 5 minutes,agreeable with the nicotine patch. Will provide nicotine patch    All the records are reviewed and case discussed with ED provider. Management plans discussed with the patient, family and they are in agreement.  CODE STATUS: full code  TOTAL TIME TAKING CARE OF THIS PATIENT: 42 minutes.   Note: This dictation was prepared with Dragon dictation along with smaller phrase technology. Any transcriptional errors that result from this process are unintentional.  Ramonita Lab M.D on 05/22/2017 at 3:02 PM  Between 7am to 6pm - Pager - [phone]  After 6pm  go to [email or web address] - password [email or web address] [facility]  [facility]  Office  [phone]  CC: Primary care physician; [facility], [facility]

## 2017-05-23 ENCOUNTER — Observation Stay: Payer: Medicaid Other

## 2017-05-23 ENCOUNTER — Observation Stay (HOSPITAL_BASED_OUTPATIENT_CLINIC_OR_DEPARTMENT_OTHER)
Admit: 2017-05-23 | Discharge: 2017-05-23 | Disposition: A | Discharge status: Home / Self Care | Payer: Medicaid Other | Attending: Internal Medicine | Admitting: Internal Medicine

## 2017-05-23 ENCOUNTER — Encounter: Payer: Self-pay | Admitting: Internal Medicine

## 2017-05-23 DIAGNOSIS — R29898 Other symptoms and signs involving the musculoskeletal system: Secondary | ICD-10-CM

## 2017-05-23 DIAGNOSIS — M25511 Pain in right shoulder: Secondary | ICD-10-CM

## 2017-05-23 DIAGNOSIS — R079 Chest pain, unspecified: Secondary | ICD-10-CM

## 2017-05-23 LAB — TROPONIN I

## 2017-05-23 LAB — HEMOGLOBIN A1C
Hgb A1c MFr Bld: 5.6 % (ref 4.8–5.6)
MEAN PLASMA GLUCOSE: 114.02 mg/dL

## 2017-05-23 LAB — ECHOCARDIOGRAM COMPLETE
Height: 68 in
WEIGHTICAEL: 2627.2 [oz_av]

## 2017-05-23 LAB — LIPID PANEL
CHOL/HDL RATIO: 4.5 ratio
Cholesterol: 167 mg/dL (ref 0–200)
HDL: 37 mg/dL — AB (ref >40)
LDL Cholesterol: 118 mg/dL — ABNORMAL HIGH (ref 0–99)
Triglycerides: 60 mg/dL (ref <150)
VLDL: 12 mg/dL (ref 0–40)

## 2017-05-23 LAB — C-REACTIVE PROTEIN: CRP: 0.8 mg/dL (ref <1.0)

## 2017-05-23 LAB — SEDIMENTATION RATE: SED RATE: 8 mm/h (ref 0–20)

## 2017-05-23 MED ORDER — CYCLOBENZAPRINE HCL 10 MG PO TABS: 10.0000 mg | ORAL_TABLET | Freq: Three times a day (TID) | ORAL | 0 refills | Status: DC | PRN

## 2017-05-23 MED ORDER — CYCLOBENZAPRINE HCL 10 MG PO TABS
10.0000 mg | ORAL_TABLET | Freq: Three times a day (TID) | ORAL | Status: DC | PRN
  Administered 2017-05-23 (×2): 10 mg via ORAL
  Filled 2017-05-23 (×2): qty 1

## 2017-05-23 NOTE — Consult Note (Signed)
Reason for Consult:Right arm weakness Referring Physician: Sudini  CC: Right arm weakness  HPI: Erin Curtis is an 31 y.o. female with a history of chronic low back pain who presented with complaints of chest pain and right upper extremity numbness/pain.  Patient reports that not long after awakening on yesterday she began to have right sided chest pain that radiated to her shoulder and arm.  She then experienced numbness and weakness in the arm.  Patient presented for evaluation.  Code stroke was called but patient was not administered tPA.  Further work up recommended.    Past Medical History:  Diagnosis Date  . Depression     Past Surgical History:  Procedure Laterality Date  . LAPAROSCOPIC TUBAL LIGATION Bilateral 07/02/2016   Procedure: LAPAROSCOPIC TUBAL LIGATION;  Surgeon: Boykin Nearing, MD;  Location: [facility] ORS;  Service: Gynecology;  Laterality: Bilateral;  . NO PAST SURGERIES      Family history: Father with heart disease and DM.  Mother with DM.  Both parents living.    Social History:  reports that she has been smoking Cigarettes.  She has a 2.25 pack-year smoking history. She has never used smokeless tobacco. She reports that she drinks alcohol. She reports that she uses drugs, including Marijuana.  Allergies  Allergen Reactions  . Vicodin [Hydrocodone-Acetaminophen] Itching    Medications:  I have reviewed the patient's current medications. Prior to Admission:  No prescriptions prior to admission.   Scheduled: . aspirin  325 mg Oral Daily  . enoxaparin (LOVENOX) injection  40 mg Subcutaneous Q24H    ROS: History obtained from the patient  General ROS: negative for - chills, fatigue, fever, night sweats, weight gain or weight loss Psychological ROS: negative for - behavioral disorder, hallucinations, memory difficulties, mood swings or suicidal ideation Ophthalmic ROS: negative for - blurry vision, double vision, eye pain or loss of vision ENT ROS:  negative for - epistaxis, nasal discharge, oral lesions, sore throat, tinnitus or vertigo Allergy and Immunology ROS: negative for - hives or itchy/watery eyes Hematological and Lymphatic ROS: negative for - bleeding problems, bruising or swollen lymph nodes Endocrine ROS: negative for - galactorrhea, hair pattern changes, polydipsia/polyuria or temperature intolerance Respiratory ROS: negative for - cough, hemoptysis, shortness of breath or wheezing Cardiovascular ROS: as noted in HPI Gastrointestinal ROS: negative for - abdominal pain, diarrhea, hematemesis, nausea/vomiting or stool incontinence Genito-Urinary ROS: negative for - dysuria, hematuria, incontinence or urinary frequency/urgency Musculoskeletal ROS: back pain, shoulder pain Neurological ROS: as noted in HPI Dermatological ROS: negative for rash and skin lesion changes  Physical Examination: Blood pressure 105/68, pulse (!) 53, temperature 98.6 F (37 C), temperature source Oral, resp. rate 16, height '5\' 8"'  (1.727 m), weight 74.5 kg (164 lb 3.2 oz), SpO2 100 %, unknown if currently breastfeeding.  HEENT-  Normocephalic, no lesions, without obvious abnormality.  Normal external eye and conjunctiva.  Normal TM's bilaterally.  Normal auditory canals and external ears. Normal external nose, mucus membranes and septum.  Normal pharynx. Cardiovascular- S1, S2 normal, pulses palpable throughout   Lungs- chest clear, no wheezing, rales, normal symmetric air entry Abdomen- soft, non-tender; bowel sounds normal; no masses,  no organomegaly Extremities- no edema Lymph-no adenopathy palpable Musculoskeletal-Pain on palpation of the chest wall, shoulder, deltoid and upper arm Skin-warm and dry, no hyperpigmentation, vitiligo, or suspicious lesions  Neurological Examination   Mental Status: Alert, oriented, thought content appropriate.  Speech fluent without evidence of aphasia.  Able to follow 3 step commands without  difficulty. Cranial  Nerves: II: Discs flat bilaterally; Visual fields grossly normal, pupils equal, round, reactive to light and accommodation III,IV, VI: ptosis not present, extra-ocular motions intact bilaterally V,VII: smile symmetric, facial light touch sensation normal bilaterally VIII: hearing normal bilaterally IX,X: gag reflex present XI: bilateral shoulder shrug XII: midline tongue extension Motor: Right : Upper extremity   Limited due to pain    Left:     Upper extremity   5/5  Lower extremity   5/5       Lower extremity   5/5 Tone and bulk:normal tone throughout; no atrophy noted Sensory: Pinprick and light touch decreased medially on the upper arm and in the hand.  Forearm sensation intact. Deep Tendon Reflexes: 2+ and symmetric throughout Plantars: Right: downgoing   Left: downgoing Cerebellar: Unable to perform due to pain Gait: not tested due to pain    Laboratory Studies:   Basic Metabolic Panel:  Recent Labs Lab 05/22/17 1212  NA 138  K 3.8  CL 107  CO2 24  GLUCOSE 89  BUN 17  CREATININE 0.70  CALCIUM 9.4    Liver Function Tests: No results for input(s): AST, ALT, ALKPHOS, BILITOT, PROT, ALBUMIN in the last 168 hours. No results for input(s): LIPASE, AMYLASE in the last 168 hours. No results for input(s): AMMONIA in the last 168 hours.  CBC:  Recent Labs Lab 05/22/17 1212  WBC 4.9  HGB 14.3  HCT 41.6  MCV 93.8  PLT 320    Cardiac Enzymes:  Recent Labs Lab 05/22/17 1212 05/22/17 1410 05/22/17 2038 05/23/17 0241  TROPONINI <0.03 <0.03 <0.03 <0.03    BNP: Invalid input(s): POCBNP  CBG: No results for input(s): GLUCAP in the last 168 hours.  Microbiology: Results for orders placed or performed during the hospital encounter of 08/31/15  Wet prep, genital     Status: Abnormal   Collection Time: 08/31/15  1:51 PM  Result Value Ref Range Status   Yeast Wet Prep HPF POC NONE SEEN NONE SEEN Final   Trich, Wet Prep NONE SEEN NONE SEEN Final   Clue Cells  Wet Prep HPF POC NONE SEEN NONE SEEN Final   WBC, Wet Prep HPF POC FEW (A) NONE SEEN Final   Sperm NONE SEEN  Final  Chlamydia/NGC rt PCR     Status: None   Collection Time: 08/31/15  1:51 PM  Result Value Ref Range Status   Specimen source GC/Chlam ENDOCERVICAL  Final   Chlamydia Tr NOT DETECTED NOT DETECTED Final   N gonorrhoeae NOT DETECTED NOT DETECTED Final    Comment: (NOTE) 100  This methodology has not been evaluated in pregnant women or in 200  patients with a history of hysterectomy. 300 400  This methodology will not be performed on patients less than 67  years of age.   Urine culture     Status: None   Collection Time: 08/31/15  2:17 PM  Result Value Ref Range Status   Specimen Description URINE, RANDOM  Final   Special Requests NONE  Final   Culture   Final    80,000 COLONIES/ml STAPHYLOCOCCUS AUREUS INDUCIBLE CLINDAMYCIN RESISTANCE - A positive ICR test is indicative of inducible resistance to macrolides, lincosamides, and type B streptogramin.  This isolate is presumed to be resistant to Clindamycin, however, Clindamycin may still be effective in  some patients.   WITH WITH MIXED BACTERIAL ORGANISMS    Report Status 09/05/2015 FINAL  Final   Organism ID, Bacteria STAPHYLOCOCCUS AUREUS  Final  Susceptibility   Staphylococcus aureus - MIC*    CIPROFLOXACIN <=0.5 SENSITIVE Sensitive     ERYTHROMYCIN >=8 RESISTANT Resistant     GENTAMICIN <=0.5 SENSITIVE Sensitive     OXACILLIN 2 SENSITIVE Sensitive     TRIMETH/SULFA <=10 SENSITIVE Sensitive     CLINDAMYCIN <=0.25 RESISTANT Resistant     CEFOXITIN SCREEN NEGATIVE Sensitive     Inducible Clindamycin Value in next row Resistant      POSITIVEINDUCIBLE CLINDAMYCIN RESISTANCE - A positive ICR test is indicative of inducible resistance to macrolides, lincosamides, and type B streptogramin.  This isolate is presumed to be resistant to Clindamycin, however, Clindamycin may still be effective in some patients.      NITROFURANTOIN Value in next row Sensitive      SENSITIVE<=16    * 80,000 COLONIES/ml STAPHYLOCOCCUS AUREUS    Coagulation Studies: No results for input(s): LABPROT, INR in the last 72 hours.  Urinalysis: No results for input(s): COLORURINE, LABSPEC, PHURINE, GLUCOSEU, HGBUR, BILIRUBINUR, KETONESUR, PROTEINUR, UROBILINOGEN, NITRITE, LEUKOCYTESUR in the last 168 hours.  Invalid input(s): APPERANCEUR  Lipid Panel:     Component Value Date/Time   CHOL 167 05/23/2017 0241   TRIG 60 05/23/2017 0241   HDL 37 (L) 05/23/2017 0241   CHOLHDL 4.5 05/23/2017 0241   VLDL 12 05/23/2017 0241   LDLCALC 118 (H) 05/23/2017 0241    HgbA1C:  Lab Results  Component Value Date   HGBA1C 5.6 05/23/2017    Urine Drug Screen:     Component Value Date/Time   LABOPIA NONE DETECTED 07/02/2016 0748   COCAINSCRNUR NONE DETECTED 07/02/2016 0748   LABBENZ NONE DETECTED 07/02/2016 0748   AMPHETMU NONE DETECTED 07/02/2016 0748   THCU POSITIVE (A) 07/02/2016 0748   LABBARB NONE DETECTED 07/02/2016 0748    Alcohol Level: No results for input(s): ETH in the last 168 hours.  Other results: EKG: normal sinus rhythm at 67 bpm.  Imaging: Dg Chest 2 View  Result Date: 05/22/2017 CLINICAL DATA:  Chest pressure. EXAM: CHEST  2 VIEW COMPARISON:  05/12/2016 FINDINGS: The heart size and mediastinal contours are within normal limits. Both lungs are clear. The visualized skeletal structures are unremarkable. IMPRESSION: No active cardiopulmonary disease. Electronically Signed   By: Misty Stanley M.D.   On: 05/22/2017 12:41   Dg Shoulder Right  Result Date: 05/23/2017 CLINICAL DATA:  Right shoulder pain.  No known injury. EXAM: RIGHT SHOULDER - 2+ VIEW COMPARISON:  No recent. FINDINGS: No acute bony joint abnormality identified.  No dislocation . IMPRESSION: No acute or focal abnormality Electronically Signed   By: Marcello Moores  Register   On: 05/23/2017 14:53   Ct Head Wo Contrast  Result Date: 05/22/2017 CLINICAL  DATA:  Headaches for several hours EXAM: CT HEAD WITHOUT CONTRAST TECHNIQUE: Contiguous axial images were obtained from the base of the skull through the vertex without intravenous contrast. COMPARISON:  None. FINDINGS: Brain: No evidence of acute infarction, hemorrhage, hydrocephalus, extra-axial collection or mass lesion/mass effect. Vascular: No hyperdense vessel or unexpected calcification. Skull: Normal. Negative for fracture or focal lesion. Sinuses/Orbits: No acute finding. Other: None. IMPRESSION: No acute intracranial abnormality noted. Electronically Signed   By: Inez Catalina M.D.   On: 05/22/2017 14:25   Mr Brain Wo Contrast  Result Date: 05/23/2017 CLINICAL DATA:  Acute onset of chest pain with right upper extremity weakness and tingling. EXAM: MRI HEAD WITHOUT CONTRAST MRA HEAD WITHOUT CONTRAST TECHNIQUE: Multiplanar, multiecho pulse sequences of the brain and surrounding structures were obtained without intravenous contrast.  Angiographic images of the head were obtained using MRA technique without contrast. COMPARISON:  CT head without contrast 05/22/2017. Carotid ultrasound 05/23/2017. FINDINGS: MRI HEAD FINDINGS Brain: No acute infarct, hemorrhage, or mass lesion is present. The ventricles are of normal size. No significant extraaxial fluid collection is present. The brainstem and cerebellum are normal. Vascular: Flow is present in the major intracranial arteries. Skull and upper cervical spine: The skullbase is within normal limits. The craniocervical junction is normal. Midline sagittal structures are unremarkable. Marrow signal is within normal limits. Sinuses/Orbits: Scattered mucosal thickening is present throughout the ethmoid air cells and right greater than left frontal sinus. Mild mucosal thickening is present in the left maxillary sinus. The globes and orbits are within normal limits. MRA HEAD FINDINGS The internal carotid artery is are within normal limits from the high cervical  segments through the ICA termini bilaterally. The A1 and M1 segments are normal. Anterior communicating artery is patent. MCA bifurcations are intact. ACA and MCA branch vessels are within normal limits bilaterally. The right vertebral artery is slightly dominant to the left. The PICA origins are visualized and normal. 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 Both posterior cerebral arteries originate from basilar tip. A post communicating artery is present on the right. PCA branch vessels are within normal limits. IMPRESSION: 1. Normal MRI of the brain. No acute or focal abnormality to account for the patient's symptoms. 2. Normal MRA circle of Willis without significant proximal stenosis, aneurysm, or branch vessel occlusion. Electronically Signed   By: San Morelle M.D.   On: 05/23/2017 10:27   US Carotid Bilateral (at [facility] Only)  Result Date: 05/23/2017 CLINICAL DATA:  TIA. EXAM: BILATERAL CAROTID DUPLEX ULTRASOUND TECHNIQUE: Pearline Cables scale imaging, color Doppler and duplex ultrasound were performed of bilateral carotid and vertebral arteries in the neck. COMPARISON:  CT 05/22/2017. FINDINGS: Criteria: Quantification of carotid stenosis is based on velocity parameters that correlate the residual internal carotid diameter with NASCET-based stenosis levels, using the diameter of the distal internal carotid lumen as the denominator for stenosis measurement. The following velocity measurements were obtained: RIGHT ICA:  98/34 cm/sec CCA:  741/28 cm/sec SYSTOLIC ICA/CCA RATIO:  0.9 DIASTOLIC ICA/CCA RATIO:  1.0 ECA:  87 cm/sec LEFT ICA:  98/38 cm/sec CCA:  786/76 cm/sec SYSTOLIC ICA/CCA RATIO:  0.9 DIASTOLIC ICA/CCA RATIO:  1.0 ECA:  85 cm/sec RIGHT CAROTID ARTERY: No significant right carotid atherosclerotic vascular disease. No flow limiting stenosis. RIGHT VERTEBRAL ARTERY:  Patent with antegrade flow. LEFT CAROTID ARTERY: No significant left carotid  atherosclerotic vascular disease. No flow limiting stenosis. LEFT VERTEBRAL ARTERY:  Patent with antegrade flow. IMPRESSION: 1. No significant carotid atherosclerotic vascular disease. 2. Vertebrals are patent with antegrade flow. Electronically Signed   By: Marcello Moores  Register   On: 05/23/2017 08:43   Mr Jodene Nam Head/brain Wo Cm  Result Date: 05/23/2017 CLINICAL DATA:  Acute onset of chest pain with right upper extremity weakness and tingling. EXAM: MRI HEAD WITHOUT CONTRAST MRA HEAD WITHOUT CONTRAST TECHNIQUE: Multiplanar, multiecho pulse sequences of the brain and surrounding structures were obtained without intravenous contrast. Angiographic images of the head were obtained using MRA technique without contrast. COMPARISON:  CT head without contrast 05/22/2017. Carotid ultrasound 05/23/2017. FINDINGS: MRI HEAD FINDINGS Brain: No acute infarct, hemorrhage, or mass lesion is present. The ventricles are of normal size. No significant extraaxial fluid collection is present. The brainstem and cerebellum are normal. Vascular: Flow is present in the major intracranial arteries. Skull  and upper cervical spine: The skullbase is within normal limits. The craniocervical junction is normal. Midline sagittal structures are unremarkable. Marrow signal is within normal limits. Sinuses/Orbits: Scattered mucosal thickening is present throughout the ethmoid air cells and right greater than left frontal sinus. Mild mucosal thickening is present in the left maxillary sinus. The globes and orbits are within normal limits. MRA HEAD FINDINGS The internal carotid artery is are within normal limits from the high cervical segments through the ICA termini bilaterally. The A1 and M1 segments are normal. Anterior communicating artery is patent. MCA bifurcations are intact. ACA and MCA branch vessels are within normal limits bilaterally. The right vertebral artery is slightly dominant to the left. The PICA origins are visualized and normal.  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 Both posterior cerebral arteries originate from basilar tip. A post communicating artery is present on the right. PCA branch vessels are within normal limits. IMPRESSION: 1. Normal MRI of the brain. No acute or focal abnormality to account for the patient's symptoms. 2. Normal MRA circle of Willis without significant proximal stenosis, aneurysm, or branch vessel occlusion. Electronically Signed   By: San Morelle M.D.   On: 05/23/2017 10:27     Assessment/Plan: 31 year old female presenting with chest pain and RUE pain/numbness.  Due to pain unable to fully evaluate for weakness.  MRI of the brain performed to rule out stroke.  MRI and MRA reviewed and both are normal.  Would investigate other etiologies based on these normal studies and the predominate complaint of pain.  Plain films of the shoulder were unremarkable.   Recommendations: 1.  MRI of the cervical spine 2.  ANA, ESR  Alexis Goodell, MD Neurology [phone] 05/23/2017, 3:45 PM

## 2017-05-23 NOTE — Progress Notes (Signed)
*  PRELIMINARY RESULTS* Echocardiogram 2D Echocardiogram has been performed.  Cristela Blue 05/23/2017, 4:20 PM

## 2017-05-23 NOTE — Progress Notes (Signed)
[facility] - [place] [facility]   PATIENT NAME: Erin Curtis    MR#:  [id number]  DATE OF BIRTH:  1986/06/27  SUBJECTIVE:  CHIEF COMPLAINT:   Chief Complaint  Patient presents with  . Chest Pain   Still has pain RUE.   REVIEW OF SYSTEMS:    Review of Systems  Constitutional: Negative for chills and fever.  HENT: Negative for sore throat.   Eyes: Negative for blurred vision, double vision and pain.  Respiratory: Negative for cough, hemoptysis, shortness of breath and wheezing.   Cardiovascular: Negative for chest pain, palpitations, orthopnea and leg swelling.  Gastrointestinal: Negative for abdominal pain, constipation, diarrhea, heartburn, nausea and vomiting.  Genitourinary: Negative for dysuria and hematuria.  Musculoskeletal: Positive for joint pain and myalgias. Negative for back pain.  Skin: Negative for rash.  Neurological: Negative for sensory change, speech change, focal weakness and headaches.  Endo/Heme/Allergies: Does not bruise/bleed easily.  Psychiatric/Behavioral: Negative for depression. The patient is not nervous/anxious.     DRUG ALLERGIES:   Allergies  Allergen Reactions  . Vicodin [Hydrocodone-Acetaminophen] Itching    VITALS:  Blood pressure (!) 96/53, pulse (!) 49, temperature 98.4 F (36.9 C), temperature source Axillary, resp. rate 20, height 5\' 8"  (1.727 m), weight 74.5 kg (164 lb 3.2 oz), SpO2 100 %, unknown if currently breastfeeding.  PHYSICAL EXAMINATION:   Physical Exam  GENERAL:  31 y.o.-year-old patient lying in the bed with no acute distress.  EYES: Pupils equal, round, reactive to light and accommodation. No scleral icterus. Extraocular muscles intact.  HEENT: Head atraumatic, normocephalic. Oropharynx and nasopharynx clear.  NECK:  Supple, no jugular venous distention. No thyroid enlargement, no tenderness.  LUNGS: Normal breath sounds bilaterally, no wheezing, rales, rhonchi. No use of accessory muscles of  respiration.  CARDIOVASCULAR: S1, S2 normal. No murmurs, rubs, or gallops.  ABDOMEN: Soft, nontender, nondistended. Bowel sounds present. No organomegaly or mass.  EXTREMITIES: No cyanosis, clubbing or edema b/l.    NEUROLOGIC:RUE pain limits exam. LUE and lower extremities motor 5/5 PSYCHIATRIC: The patient is alert and oriented x 3.  SKIN: No obvious rash, lesion, or ulcer.   LABORATORY PANEL:   CBC  Recent Labs Lab 05/22/17 1212  WBC 4.9  HGB 14.3  HCT 41.6  PLT 320   ------------------------------------------------------------------------------------------------------------------ Chemistries   Recent Labs Lab 05/22/17 1212  NA 138  K 3.8  CL 107  CO2 24  GLUCOSE 89  BUN 17  CREATININE 0.70  CALCIUM 9.4   ------------------------------------------------------------------------------------------------------------------  Cardiac Enzymes  Recent Labs Lab 05/23/17 0241  TROPONINI <0.03   ------------------------------------------------------------------------------------------------------------------  RADIOLOGY:  Dg Chest 2 View  Result Date: 05/22/2017 CLINICAL DATA:  Chest pressure. EXAM: CHEST  2 VIEW COMPARISON:  05/12/2016 FINDINGS: The heart size and mediastinal contours are within normal limits. Both lungs are clear. The visualized skeletal structures are unremarkable. IMPRESSION: No active cardiopulmonary disease. Electronically Signed   By: Kennith Center M.D.   On: 05/22/2017 12:41   Dg Shoulder Right  Result Date: 05/23/2017 CLINICAL DATA:  Right shoulder pain.  No known injury. EXAM: RIGHT SHOULDER - 2+ VIEW COMPARISON:  No recent. FINDINGS: No acute bony joint abnormality identified.  No dislocation . IMPRESSION: No acute or focal abnormality Electronically Signed   By: Maisie Fus  Register   On: 05/23/2017 14:53   Ct Head Wo Contrast  Result Date: 05/22/2017 CLINICAL DATA:  Headaches for several hours EXAM: CT HEAD WITHOUT CONTRAST TECHNIQUE:  Contiguous axial images were obtained from the base  of the skull through the vertex without intravenous contrast. COMPARISON:  None. FINDINGS: Brain: No evidence of acute infarction, hemorrhage, hydrocephalus, extra-axial collection or mass lesion/mass effect. Vascular: No hyperdense vessel or unexpected calcification. Skull: Normal. Negative for fracture or focal lesion. Sinuses/Orbits: No acute finding. Other: None. IMPRESSION: No acute intracranial abnormality noted. Electronically Signed   By: Alcide Clever M.D.   On: 05/22/2017 14:25   Mr Brain Wo Contrast  Result Date: 05/23/2017 CLINICAL DATA:  Acute onset of chest pain with right upper extremity weakness and tingling. EXAM: MRI HEAD WITHOUT CONTRAST MRA HEAD WITHOUT CONTRAST TECHNIQUE: Multiplanar, multiecho pulse sequences of the brain and surrounding structures were obtained without intravenous contrast. Angiographic images of the head were obtained using MRA technique without contrast. COMPARISON:  CT head without contrast 05/22/2017. Carotid ultrasound 05/23/2017. FINDINGS: MRI HEAD FINDINGS Brain: No acute infarct, hemorrhage, or mass lesion is present. The ventricles are of normal size. No significant extraaxial fluid collection is present. The brainstem and cerebellum are normal. Vascular: Flow is present in the major intracranial arteries. Skull and upper cervical spine: The skullbase is within normal limits. The craniocervical junction is normal. Midline sagittal structures are unremarkable. Marrow signal is within normal limits. Sinuses/Orbits: Scattered mucosal thickening is present throughout the ethmoid air cells and right greater than left frontal sinus. Mild mucosal thickening is present in the left maxillary sinus. The globes and orbits are within normal limits. MRA HEAD FINDINGS The internal carotid artery is are within normal limits from the high cervical segments through the ICA termini bilaterally. The A1 and M1 segments are normal.  Anterior communicating artery is patent. MCA bifurcations are intact. ACA and MCA branch vessels are within normal limits bilaterally. The right vertebral artery is slightly dominant to the left. The PICA origins are visualized and normal. 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 Both posterior cerebral arteries originate from basilar tip. A post communicating artery is present on the right. PCA branch vessels are within normal limits. IMPRESSION: 1. Normal MRI of the brain. No acute or focal abnormality to account for the patient's symptoms. 2. Normal MRA circle of Willis without significant proximal stenosis, aneurysm, or branch vessel occlusion. Electronically Signed   By: Marin Roberts M.D.   On: 05/23/2017 10:27   US Carotid Bilateral (at [facility] Only)  Result Date: 05/23/2017 CLINICAL DATA:  TIA. EXAM: BILATERAL CAROTID DUPLEX ULTRASOUND TECHNIQUE: Wallace Cullens scale imaging, color Doppler and duplex ultrasound were performed of bilateral carotid and vertebral arteries in the neck. COMPARISON:  CT 05/22/2017. FINDINGS: Criteria: Quantification of carotid stenosis is based on velocity parameters that correlate the residual internal carotid diameter with NASCET-based stenosis levels, using the diameter of the distal internal carotid lumen as the denominator for stenosis measurement. The following velocity measurements were obtained: RIGHT ICA:  98/34 cm/sec CCA:  103/33 cm/sec SYSTOLIC ICA/CCA RATIO:  0.9 DIASTOLIC ICA/CCA RATIO:  1.0 ECA:  87 cm/sec LEFT ICA:  98/38 cm/sec CCA:  106/37 cm/sec SYSTOLIC ICA/CCA RATIO:  0.9 DIASTOLIC ICA/CCA RATIO:  1.0 ECA:  85 cm/sec RIGHT CAROTID ARTERY: No significant right carotid atherosclerotic vascular disease. No flow limiting stenosis. RIGHT VERTEBRAL ARTERY:  Patent with antegrade flow. LEFT CAROTID ARTERY: No significant left carotid atherosclerotic vascular disease. No flow limiting stenosis. LEFT VERTEBRAL ARTERY:   Patent with antegrade flow. IMPRESSION: 1. No significant carotid atherosclerotic vascular disease. 2. Vertebrals are patent with antegrade flow. Electronically Signed   By: Maisie Fus  Register   On:  05/23/2017 08:43   Mr Maxine Glenn Head/brain BC Cm  Result Date: 05/23/2017 CLINICAL DATA:  Acute onset of chest pain with right upper extremity weakness and tingling. EXAM: MRI HEAD WITHOUT CONTRAST MRA HEAD WITHOUT CONTRAST TECHNIQUE: Multiplanar, multiecho pulse sequences of the brain and surrounding structures were obtained without intravenous contrast. Angiographic images of the head were obtained using MRA technique without contrast. COMPARISON:  CT head without contrast 05/22/2017. Carotid ultrasound 05/23/2017. FINDINGS: MRI HEAD FINDINGS Brain: No acute infarct, hemorrhage, or mass lesion is present. The ventricles are of normal size. No significant extraaxial fluid collection is present. The brainstem and cerebellum are normal. Vascular: Flow is present in the major intracranial arteries. Skull and upper cervical spine: The skullbase is within normal limits. The craniocervical junction is normal. Midline sagittal structures are unremarkable. Marrow signal is within normal limits. Sinuses/Orbits: Scattered mucosal thickening is present throughout the ethmoid air cells and right greater than left frontal sinus. Mild mucosal thickening is present in the left maxillary sinus. The globes and orbits are within normal limits. MRA HEAD FINDINGS The internal carotid artery is are within normal limits from the high cervical segments through the ICA termini bilaterally. The A1 and M1 segments are normal. Anterior communicating artery is patent. MCA bifurcations are intact. ACA and MCA branch vessels are within normal limits bilaterally. The right vertebral artery is slightly dominant to the left. The PICA origins are visualized and normal. 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 Both  posterior cerebral arteries originate from basilar tip. A post communicating artery is present on the right. PCA branch vessels are within normal limits. IMPRESSION: 1. Normal MRI of the brain. No acute or focal abnormality to account for the patient's symptoms. 2. Normal MRA circle of Willis without significant proximal stenosis, aneurysm, or branch vessel occlusion. Electronically Signed   By: Marin Roberts M.D.   On: 05/23/2017 10:27     ASSESSMENT AND PLAN:   * RUE pain and weakness likely musculoskeletal Checked Right shoulder xray and normal MRI cervical spine pending Appreciate neurology input  All the records are reviewed and case discussed with Care Management/Social Worker Management plans discussed with the patient, family and they are in agreement.  CODE STATUS: FULL CODE  DVT Prophylaxis: SCDs  TOTAL TIME TAKING CARE OF THIS PATIENT: 30 minutes.   POSSIBLE D/C IN 1-2 DAYS, DEPENDING ON CLINICAL CONDITION.  Milagros Loll R M.D on 05/23/2017 at 8:09 PM  Between 7am to 6pm - Pager - [phone]  After 6pm go to [email or web address] - password [email or web address]  [facility]  Office  [phone]  CC: Primary care physician; [facility], [facility]  Note: This dictation was prepared with Dragon dictation along with smaller phrase technology. Any transcriptional errors that result from this process are unintentional.

## 2017-05-23 NOTE — Progress Notes (Signed)
SLP Cancellation Note  Patient Details Name: Erin Curtis MRN: [id number] DOB: September 25, 1986   Cancelled treatment:       Reason Eval/Treat Not Completed: SLP screened, no needs identified, will sign off. Please reconsult if needs arise. Pt/boyfriend were encouraged to notify PCP if difficulties arise once pt returns to normal routine.  [place]endale Wherry B. Murvin Natal, [facility], CCC-SLP Speech Pathologist [phone] 05/23/2017, 1:31 PM

## 2017-05-23 NOTE — Discharge Instructions (Signed)
Resume diet and activity as before ° ° °

## 2017-05-23 NOTE — Progress Notes (Signed)
OT Cancellation Note  Patient Details Name: ADALAE KAAI MRN: [id number] DOB: 27-Jul-1986   Cancelled Treatment:    Reason Eval/Treat Not Completed: Patient at procedure or test/ unavailable. Order received, chart reviewed. Pt currently out of the room for testing. Will re-attempt OT evaluation at later time as pt is available.  Richrd Prime, MPH, MS, OTR/L ascom [phone] 05/23/17, 9:28 AM

## 2017-05-23 NOTE — Progress Notes (Addendum)
[facility]         [place], [place].   05/23/2017  Patient: Erin Curtis   Date of Birth:  [birth year]  Date of admission:  05/22/2017  Date of Discharge  05/24/2017    To Whom it May Concern:   Leslie Dales  May return to work 05/30/2017  PHYSICAL ACTIVITY:  Full  If you have any questions or concerns, please don't hesitate to call.  Sincerely,   Milagros Loll R M.D Office : [phone]   .

## 2017-05-23 NOTE — Evaluation (Signed)
Occupational Therapy Evaluation Patient Details Name: Erin Curtis MRN: [id number] DOB: 1986/04/06 Today's Date: 05/23/2017    History of Present Illness 31 y.o.femalewith a known history of chronic low back pain, depression, tobacco abuse disorder presented to the ED 8/26 with a chief complaint of chest pain and RUE weakness associated with tingling. Denied any headache or blurry vision. Denied any speech difficulty or swallowing problems. Pt reported right-sided chest pressure radiating to the right arm. Initial troponin is negative. EKG is normal. CT head is normal. MRI/MRA is normal.    Clinical Impression   Pt seen for OT evaluation this date. Pt was independent prior to admission, working 2 jobs, driving, and caring for 3 children. Pt living in apartment with boyfriend, children, sister, and sister's boyfriend. Pt presents with nausea throughout session, significant pain in RUE with and without movement, numbness and tingling throughout RUE, mild swelling noted in R hand/fingers, impaired strength/ROM in RUE, and slightly impaired balance, requiring increased assist for bathing, dressing, and toileting due primarily to pain and impaired RUE functional use. No visual deficits noted, impaired finger to nose and finger to thumb opposition testing for RUE. Pt will benefit from skilled OT services to address noted impairments and functional deficits in order to maximize return to prior functional independence. Recommend OPOT services for RUE impairments/deficits should pt's symptoms not resolve.     Follow Up Recommendations  Outpatient OT;Supervision - Intermittent    Equipment Recommendations  None recommended by OT    Recommendations for Other Services       Precautions / Restrictions Precautions Precautions: Fall Restrictions Weight Bearing Restrictions: No      Mobility Bed Mobility Overal bed mobility: Needs Assistance Bed Mobility: Supine to Sit;Sit to Supine     Supine  to sit: Supervision;HOB elevated Sit to supine: Supervision   General bed mobility comments: increased time/effort, used LUE on bed rails to assist  Transfers Overall transfer level: Needs assistance Equipment used: None Transfers: Sit to/from Stand Sit to Stand: Min guard         General transfer comment: no LOB noted, pt reported nausea throughout session, did not get worse with movement    Balance Overall balance assessment: Needs assistance Sitting-balance support: No upper extremity supported;Feet supported Sitting balance-Leahy Scale: Good     Standing balance support: During functional activity;No upper extremity supported Standing balance-Leahy Scale: Fair Standing balance comment: pt reported R side felt "heavier", looked slightly unsteady but no overt LOB                           ADL either performed or assessed with clinical judgement   ADL Overall ADL's : Needs assistance/impaired Eating/Feeding: Set up;Sitting Eating/Feeding Details (indicate cue type and reason): using L hand Grooming: Standing;Min guard;Wash/dry hands   Upper Body Bathing: Sitting;Set up;Minimal assistance   Lower Body Bathing: Sitting/lateral leans;Minimal assistance;Set up   Upper Body Dressing : Minimal assistance;Sitting Upper Body Dressing Details (indicate cue type and reason): min assist to change hospital gowns while pt was seated, no LOB noted, pt kept RUE in extension did not incorporate RUE into task Lower Body Dressing: Minimal assistance;Sitting/lateral leans   Toilet Transfer: Min guard;Ambulation;Regular Social worker and Hygiene: Independent       Functional mobility during ADLs: Min guard General ADL Comments: pt generally min assist level for self care tasks with RUE currently very limited     Vision Baseline Vision/History: Wears glasses  Wears Glasses: At all times Patient Visual Report: No change from baseline Vision  Assessment?: No apparent visual deficits     Perception     Praxis      Pertinent Vitals/Pain Pain Assessment: 0-10 Pain Score: 8  Pain Location: RUE Pain Descriptors / Indicators: Constant;Burning;Grimacing;Guarding;Sharp;Numbness;Tingling Pain Intervention(s): Limited activity within patient's tolerance;Monitored during session;Repositioned     Hand Dominance Right   Extremity/Trunk Assessment Upper Extremity Assessment Upper Extremity Assessment: RUE deficits/detail (LUE WFL, intact sensation) RUE Deficits / Details: shoulder flexion 2+/5 AROM to 45 degrees, limited by significant pain; swelling noted in R hand/fingers, poor grip strength; numbness/tingling throughout RUE; elbow flex/ext 3/5; impaired finger to nose testing, impaired finger to thumb opposition testing, positive pronator drift RUE Coordination: decreased fine motor;decreased gross motor   Lower Extremity Assessment Lower Extremity Assessment: Defer to PT evaluation;Overall WFL for tasks assessed (strength/ROM WFL, pt notes RLE feels "heavier")   Cervical / Trunk Assessment Cervical / Trunk Assessment: Normal   Communication Communication Communication: No difficulties   Cognition Arousal/Alertness: Awake/alert Behavior During Therapy: WFL for tasks assessed/performed Overall Cognitive Status: Within Functional Limits for tasks assessed                                     General Comments  R hand/fingers noted to be slightly swollen    Exercises     Shoulder Instructions      Home Living Family/patient expects to be discharged to:: Private residence Living Arrangements: Spouse/significant other;Children;Other relatives (lives with boyfriend, sister and her boyfriend, and 3 daughters ages 46, 59, and 48) Available Help at Discharge: Family;Available 24 hours/day Type of Home: Apartment Home Access: Level entry     Home Layout: One level     Bathroom Shower/Tub: Scientist, research (life sciences): Standard     Home Equipment: None          Prior Functioning/Environment Level of Independence: Independent        Comments: pt indep at baseline with all mobility, ADL, driving, working 2 jobs in [employer], and caring for her family        OT Problem List: Pain;Decreased strength;Decreased range of motion;Impaired sensation;Increased edema;Impaired balance (sitting and/or standing);Impaired UE functional use      OT Treatment/Interventions: Self-care/ADL training;Therapeutic exercise;Therapeutic activities;Energy conservation;DME and/or AE instruction;Patient/family education    OT Goals(Current goals can be found in the care plan section) Acute Rehab OT Goals Patient Stated Goal: go home OT Goal Formulation: With patient/family Time For Goal Achievement: 05/30/17 Potential to Achieve Goals: Good  OT Frequency: Min 2X/week   Barriers to D/C:            Co-evaluation              AM-PAC PT "6 Clicks" Daily Activity     Outcome Measure Help from another person eating meals?: None Help from another person taking care of personal grooming?: A Little Help from another person toileting, which includes using toliet, bedpan, or urinal?: A Little Help from another person bathing (including washing, rinsing, drying)?: A Little Help from another person to put on and taking off regular upper body clothing?: A Little Help from another person to put on and taking off regular lower body clothing?: A Little 6 Click Score: 19   End of Session Equipment Utilized During Treatment: Gait belt  Activity Tolerance: Patient tolerated treatment well Patient left: in bed;with call  bell/phone within reach;with family/visitor present  OT Visit Diagnosis: Other abnormalities of gait and mobility (R26.89);Muscle weakness (generalized) (M62.81);Pain Pain - Right/Left: Right Pain - part of body: Arm                Time: 1610-9604 OT Time Calculation (min): 21  min Charges:  OT General Charges $OT Visit: 1 Visit OT Evaluation $OT Eval Low Complexity: 1 Low G-Codes: OT G-codes **NOT FOR INPATIENT CLASS** Functional Assessment Tool Used: AM-PAC 6 Clicks Daily Activity;Clinical judgement Functional Limitation: Self care Self Care Current Status (V4098): At least 40 percent but less than 60 percent impaired, limited or restricted Self Care Goal Status (J1914): At least 1 percent but less than 20 percent impaired, limited or restricted   Richrd Prime, MPH, MS, OTR/L ascom [phone] 05/23/17, 12:17 PM

## 2017-05-23 NOTE — Progress Notes (Signed)
PT Cancellation Note  Patient Details Name: [name] MRN: [id number] DOB: [birth year]   Cancelled Treatment:    Reason Eval/Treat Not Completed: Other (comment). Pt currently out of room for imaging. Will re-attempt, time permitting.   Kaylianna Detert 05/23/2017, 8:40 AM  Elizabeth Palau, PT, DPT [phone]

## 2017-05-24 LAB — HIV ANTIBODY (ROUTINE TESTING W REFLEX): HIV Screen 4th Generation wRfx: NONREACTIVE

## 2017-05-24 NOTE — Progress Notes (Addendum)
Discharge instructions given and went over with patient at bedside. Prescription given and reviewed. All questions answered. Patient discharged home with boyfriend. Bo Mcclintock, RN  Work note provided to patient. 10:52 AM

## 2017-05-24 NOTE — Progress Notes (Signed)
Subjective: Patient continues to have pain limiting movement.  Does report better hand movement today.    Objective: Current vital signs: BP (!) 102/53   Pulse (!) 48   Temp 98.6 F (37 C)   Resp 20   Ht 5' 8" (1.727 m)   Wt 74.5 kg (164 lb 3.2 oz)   SpO2 98%   BMI 24.97 kg/m  Vital signs in last 24 hours: Temp:  [98.1 F (36.7 C)-98.6 F (37 C)] 98.6 F (37 C) (08/28 0448) Pulse Rate:  [48-54] 48 (08/28 0803) Resp:  [20] 20 (08/28 0448) BP: (96-118)/(53-83) 102/53 (08/28 0803) SpO2:  [98 %-100 %] 98 % (08/28 0803)  Intake/Output from previous day: 08/27 0701 - 08/28 0700 In: 240 [P.O.:240] Out: -  Intake/Output this shift: No intake/output data recorded. Nutritional status: Diet Heart Room service appropriate? Yes; Fluid consistency: Thin  Neurologic Exam: Mental Status: Alert, oriented, thought content appropriate.  Speech fluent without evidence of aphasia.  Able to follow 3 step commands without difficulty. Cranial Nerves: II: Discs flat bilaterally; Visual fields grossly normal, pupils equal, round, reactive to light and accommodation III,IV, VI: ptosis not present, extra-ocular motions intact bilaterally V,VII: smile symmetric, facial light touch sensation normal bilaterally VIII: hearing normal bilaterally IX,X: gag reflex present XI: bilateral shoulder shrug XII: midline tongue extension Motor: Right :  Upper extremity   Limited due to pain                                     Left:     Upper extremity   5/5             Lower extremity   5/5                                                                          Lower extremity   5/5 Tone and bulk:normal tone throughout; no atrophy noted   Lab Results: Basic Metabolic Panel:  Recent Labs Lab 05/22/17 1212  NA 138  K 3.8  CL 107  CO2 24  GLUCOSE 89  BUN 17  CREATININE 0.70  CALCIUM 9.4    Liver Function Tests: No results for input(s): AST, ALT, ALKPHOS, BILITOT, PROT, ALBUMIN in the last 168  hours. No results for input(s): LIPASE, AMYLASE in the last 168 hours. No results for input(s): AMMONIA in the last 168 hours.  CBC:  Recent Labs Lab 05/22/17 1212  WBC 4.9  HGB 14.3  HCT 41.6  MCV 93.8  PLT 320    Cardiac Enzymes:  Recent Labs Lab 05/22/17 1212 05/22/17 1410 05/22/17 2038 05/23/17 0241  TROPONINI <0.03 <0.03 <0.03 <0.03    Lipid Panel:  Recent Labs Lab 05/23/17 0241  CHOL 167  TRIG 60  HDL 37*  CHOLHDL 4.5  VLDL 12  LDLCALC 118*    CBG: No results for input(s): GLUCAP in the last 168 hours.  Microbiology: Results for orders placed or performed during the hospital encounter of 08/31/15  Wet prep, genital     Status: Abnormal   Collection Time: 08/31/15  1:51 PM  Result Value Ref Range Status   Yeast Wet Prep  HPF POC NONE SEEN NONE SEEN Final   Trich, Wet Prep NONE SEEN NONE SEEN Final   Clue Cells Wet Prep HPF POC NONE SEEN NONE SEEN Final   WBC, Wet Prep HPF POC FEW (A) NONE SEEN Final   Sperm NONE SEEN  Final  Chlamydia/NGC rt PCR     Status: None   Collection Time: 08/31/15  1:51 PM  Result Value Ref Range Status   Specimen source GC/Chlam ENDOCERVICAL  Final   Chlamydia Tr NOT DETECTED NOT DETECTED Final   N gonorrhoeae NOT DETECTED NOT DETECTED Final    Comment: (NOTE) 100  This methodology has not been evaluated in pregnant women or in 200  patients with a history of hysterectomy. 300 400  This methodology will not be performed on patients less than 54  years of age.   Urine culture     Status: None   Collection Time: 08/31/15  2:17 PM  Result Value Ref Range Status   Specimen Description URINE, RANDOM  Final   Special Requests NONE  Final   Culture   Final    80,000 COLONIES/ml STAPHYLOCOCCUS AUREUS INDUCIBLE CLINDAMYCIN RESISTANCE - A positive ICR test is indicative of inducible resistance to macrolides, lincosamides, and type B streptogramin.  This isolate is presumed to be resistant to Clindamycin, however,  Clindamycin may still be effective in  some patients.   WITH WITH MIXED BACTERIAL ORGANISMS    Report Status 09/05/2015 FINAL  Final   Organism ID, Bacteria STAPHYLOCOCCUS AUREUS  Final      Susceptibility   Staphylococcus aureus - MIC*    CIPROFLOXACIN <=0.5 SENSITIVE Sensitive     ERYTHROMYCIN >=8 RESISTANT Resistant     GENTAMICIN <=0.5 SENSITIVE Sensitive     OXACILLIN 2 SENSITIVE Sensitive     TRIMETH/SULFA <=10 SENSITIVE Sensitive     CLINDAMYCIN <=0.25 RESISTANT Resistant     CEFOXITIN SCREEN NEGATIVE Sensitive     Inducible Clindamycin Value in next row Resistant      POSITIVEINDUCIBLE CLINDAMYCIN RESISTANCE - A positive ICR test is indicative of inducible resistance to macrolides, lincosamides, and type B streptogramin.  This isolate is presumed to be resistant to Clindamycin, however, Clindamycin may still be effective in some patients.     NITROFURANTOIN Value in next row Sensitive      SENSITIVE<=16    * 80,000 COLONIES/ml STAPHYLOCOCCUS AUREUS    Coagulation Studies: No results for input(s): LABPROT, INR in the last 72 hours.  Imaging: Dg Chest 2 View  Result Date: 05/22/2017 CLINICAL DATA:  Chest pressure. EXAM: CHEST  2 VIEW COMPARISON:  05/12/2016 FINDINGS: The heart size and mediastinal contours are within normal limits. Both lungs are clear. The visualized skeletal structures are unremarkable. IMPRESSION: No active cardiopulmonary disease. Electronically Signed   By: Misty Stanley M.D.   On: 05/22/2017 12:41   Dg Shoulder Right  Result Date: 05/23/2017 CLINICAL DATA:  Right shoulder pain.  No known injury. EXAM: RIGHT SHOULDER - 2+ VIEW COMPARISON:  No recent. FINDINGS: No acute bony joint abnormality identified.  No dislocation . IMPRESSION: No acute or focal abnormality Electronically Signed   By: Marcello Moores  Register   On: 05/23/2017 14:53   Ct Head Wo Contrast  Result Date: 05/22/2017 CLINICAL DATA:  Headaches for several hours EXAM: CT HEAD WITHOUT CONTRAST  TECHNIQUE: Contiguous axial images were obtained from the base of the skull through the vertex without intravenous contrast. COMPARISON:  None. FINDINGS: Brain: No evidence of acute infarction, hemorrhage, hydrocephalus, extra-axial collection  or mass lesion/mass effect. Vascular: No hyperdense vessel or unexpected calcification. Skull: Normal. Negative for fracture or focal lesion. Sinuses/Orbits: No acute finding. Other: None. IMPRESSION: No acute intracranial abnormality noted. Electronically Signed   By: Mark  Lukens M.D.   On: 05/22/2017 14:25   Mr Brain Wo Contrast  Result Date: 05/23/2017 CLINICAL DATA:  Acute onset of chest pain with right upper extremity weakness and tingling. EXAM: MRI HEAD WITHOUT CONTRAST MRA HEAD WITHOUT CONTRAST TECHNIQUE: Multiplanar, multiecho pulse sequences of the brain and surrounding structures were obtained without intravenous contrast. Angiographic images of the head were obtained using MRA technique without contrast. COMPARISON:  CT head without contrast 05/22/2017. Carotid ultrasound 05/23/2017. FINDINGS: MRI HEAD FINDINGS Brain: No acute infarct, hemorrhage, or mass lesion is present. The ventricles are of normal size. No significant extraaxial fluid collection is present. The brainstem and cerebellum are normal. Vascular: Flow is present in the major intracranial arteries. Skull and upper cervical spine: The skullbase is within normal limits. The craniocervical junction is normal. Midline sagittal structures are unremarkable. Marrow signal is within normal limits. Sinuses/Orbits: Scattered mucosal thickening is present throughout the ethmoid air cells and right greater than left frontal sinus. Mild mucosal thickening is present in the left maxillary sinus. The globes and orbits are within normal limits. MRA HEAD FINDINGS The internal carotid artery is are within normal limits from the high cervical segments through the ICA termini bilaterally. The A1 and M1 segments are  normal. Anterior communicating artery is patent. MCA bifurcations are intact. ACA and MCA branch vessels are within normal limits bilaterally. The right vertebral artery is slightly dominant to the left. The PICA origins are visualized and normal. 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 Both posterior cerebral arteries originate from basilar tip. A post communicating artery is present on the right. PCA branch vessels are within normal limits. IMPRESSION: 1. Normal MRI of the brain. No acute or focal abnormality to account for the patient's symptoms. 2. Normal MRA circle of Willis without significant proximal stenosis, aneurysm, or branch vessel occlusion. Electronically Signed   By: Christopher  Mattern M.D.   On: 05/23/2017 10:27   Mr Cervical Spine Wo Contrast  Result Date: 05/23/2017 CLINICAL DATA:  Upper extremity weakness.  Followup code stroke. EXAM: MRI CERVICAL SPINE WITHOUT CONTRAST TECHNIQUE: Multiplanar, multisequence MR imaging of the cervical spine was performed. No intravenous contrast was administered. COMPARISON:  MRI of the head May 23, 2017 at 0922 hours FINDINGS: ALIGNMENT: Straightened cervical lordosis.  No malalignment. VERTEBRAE/DISCS: Vertebral bodies are intact. Intervertebral disc morphology's and signal are normal. No abnormal or acute bone marrow signal. CORD:Cervical spinal cord is normal morphology and signal characteristics from the cervicomedullary junction to level of T1-2, the most caudal well visualized level. POSTERIOR FOSSA, VERTEBRAL ARTERIES, PARASPINAL TISSUES: No MR findings of ligamentous injury. Vertebral artery flow voids present. Included posterior fossa and paraspinal soft tissues are normal. DISC LEVELS: C2-3 through C5-6: No disc bulge, canal stenosis nor neural foraminal narrowing. C6-7: Tiny RIGHT central disc protrusion without canal stenosis or neural foraminal narrowing. C7-T1: No disc bulge, canal stenosis nor  neural foraminal narrowing. IMPRESSION: 1. Tiny C6-7 disc protrusion without canal stenosis or neural foraminal narrowing any level. Electronically Signed   By: Courtnay  Bloomer M.D.   On: 05/23/2017 21:08   Us Carotid Bilateral (at [facility] Only)  Result Date: 05/23/2017 CLINICAL DATA:  TIA. EXAM: BILATERAL CAROTID DUPLEX ULTRASOUND TECHNIQUE: Gray scale imaging, color Doppler and duplex ultrasound   were performed of bilateral carotid and vertebral arteries in the neck. COMPARISON:  CT 05/22/2017. FINDINGS: Criteria: Quantification of carotid stenosis is based on velocity parameters that correlate the residual internal carotid diameter with NASCET-based stenosis levels, using the diameter of the distal internal carotid lumen as the denominator for stenosis measurement. The following velocity measurements were obtained: RIGHT ICA:  98/34 cm/sec CCA:  638/75 cm/sec SYSTOLIC ICA/CCA RATIO:  0.9 DIASTOLIC ICA/CCA RATIO:  1.0 ECA:  87 cm/sec LEFT ICA:  98/38 cm/sec CCA:  643/32 cm/sec SYSTOLIC ICA/CCA RATIO:  0.9 DIASTOLIC ICA/CCA RATIO:  1.0 ECA:  85 cm/sec RIGHT CAROTID ARTERY: No significant right carotid atherosclerotic vascular disease. No flow limiting stenosis. RIGHT VERTEBRAL ARTERY:  Patent with antegrade flow. LEFT CAROTID ARTERY: No significant left carotid atherosclerotic vascular disease. No flow limiting stenosis. LEFT VERTEBRAL ARTERY:  Patent with antegrade flow. IMPRESSION: 1. No significant carotid atherosclerotic vascular disease. 2. Vertebrals are patent with antegrade flow. Electronically Signed   By: Marcello Moores  Register   On: 05/23/2017 08:43   Mr Jodene Nam Head/brain Wo Cm  Result Date: 05/23/2017 CLINICAL DATA:  Acute onset of chest pain with right upper extremity weakness and tingling. EXAM: MRI HEAD WITHOUT CONTRAST MRA HEAD WITHOUT CONTRAST TECHNIQUE: Multiplanar, multiecho pulse sequences of the brain and surrounding structures were obtained without intravenous contrast. Angiographic images  of the head were obtained using MRA technique without contrast. COMPARISON:  CT head without contrast 05/22/2017. Carotid ultrasound 05/23/2017. FINDINGS: MRI HEAD FINDINGS Brain: No acute infarct, hemorrhage, or mass lesion is present. The ventricles are of normal size. No significant extraaxial fluid collection is present. The brainstem and cerebellum are normal. Vascular: Flow is present in the major intracranial arteries. Skull and upper cervical spine: The skullbase is within normal limits. The craniocervical junction is normal. Midline sagittal structures are unremarkable. Marrow signal is within normal limits. Sinuses/Orbits: Scattered mucosal thickening is present throughout the ethmoid air cells and right greater than left frontal sinus. Mild mucosal thickening is present in the left maxillary sinus. The globes and orbits are within normal limits. MRA HEAD FINDINGS The internal carotid artery is are within normal limits from the high cervical segments through the ICA termini bilaterally. The A1 and M1 segments are normal. Anterior communicating artery is patent. MCA bifurcations are intact. ACA and MCA branch vessels are within normal limits bilaterally. The right vertebral artery is slightly dominant to the left. The PICA origins are visualized and normal. 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 Both posterior cerebral arteries originate from basilar tip. A post communicating artery is present on the right. PCA branch vessels are within normal limits. IMPRESSION: 1. Normal MRI of the brain. No acute or focal abnormality to account for the patient's symptoms. 2. Normal MRA circle of Willis without significant proximal stenosis, aneurysm, or branch vessel occlusion. Electronically Signed   By: San Morelle M.D.   On: 05/23/2017 10:27    Medications:  I have reviewed the patient's current medications. Scheduled: . aspirin  325 mg Oral Daily  .  enoxaparin (LOVENOX) injection  40 mg Subcutaneous Q24H    Assessment/Plan: Patient with continued pain but better hand movement.  MRI of the cervical spine reviewed and shows nothing to explain current presentation.  ESR 8, CRP .8, ANA pending.    No further inpatient neurological evaluation indicated.  Patient may follow up with neurology on an outpatient basis with NCV/EMG testing to possibly be performed.    Case discussed with Dr.  Sudini    LOS: 0 days   Alexis Goodell, MD Neurology [phone] 05/24/2017  10:27 AM

## 2017-05-24 NOTE — Evaluation (Signed)
Physical Therapy Evaluation Patient Details Name: OVEDA DADAMO MRN: [id number] DOB: 1986/09/09 Today's Date: 05/24/2017   History of Present Illness  31 y.o.femalewith a known history of chronic low back pain, depression, tobacco abuse disorder presented to the ED 8/26 with a chief complaint of chest pain and RUE weakness associated with tingling. Denied any headache or blurry vision. Denied any speech difficulty or swallowing problems. Pt reported right-sided chest pressure radiating to the right arm. Initial troponin is negative. EKG is normal. CT head is normal. MRI/MRA is normal.   Clinical Impression  Pt is a pleasant 31 year old female who was admitted for possible CVA, all imaging negative at this time. Pt performs bed mobility, transfers, and ambulation with with independence. Reports increased R UE pain primarily in shoulder. Per patient, decreased swelling noted and slight sensation deficits. Secondary to pain, difficult to fully assess strength/weakness, appears limited. Appears musculoskeletal in nature. Would benefit from skilled PT to address above deficits and promote optimal return to PLOF. Recommend OP PT at this time. No follow up for acute care services at this time.      Follow Up Recommendations Outpatient PT    Equipment Recommendations  None recommended by PT    Recommendations for Other Services       Precautions / Restrictions Precautions Precautions: Fall Restrictions Weight Bearing Restrictions: No      Mobility  Bed Mobility Overal bed mobility: Independent Bed Mobility: Supine to Sit     Supine to sit: Independent Sit to supine: Independent   General bed mobility comments: safe technique performed with mobility. Once seated, pt able to maintain balance.  Transfers Overall transfer level: Independent Equipment used: None Transfers: Sit to/from Stand Sit to Stand: Supervision         General transfer comment: slow movement noted, however  safe technique. Upright posture noted  Ambulation/Gait Ambulation/Gait assistance: Supervision Ambulation Distance (Feet): 40 Feet Assistive device: None Gait Pattern/deviations: Step-through pattern     General Gait Details: slow gait speed, however no LOB noted. Pain limiting mobility at this time. Guarding noted on R UE during ambulation  Stairs            Wheelchair Mobility    Modified Rankin (Stroke Patients Only)       Balance Overall balance assessment: Independent   Sitting balance-Leahy Scale: Normal                                       Pertinent Vitals/Pain Pain Assessment: Faces Faces Pain Scale: Hurts even more Pain Location: RUE Pain Descriptors / Indicators: Discomfort;Dull;Heaviness Pain Intervention(s): Limited activity within patient's tolerance    Home Living Family/patient expects to be discharged to:: Private residence Living Arrangements: Spouse/significant other;Children;Other relatives Available Help at Discharge: Family;Available 24 hours/day Type of Home: Apartment Home Access: Level entry     Home Layout: One level Home Equipment: None      Prior Function Level of Independence: Independent         Comments: pt indep at baseline with all mobility, ADL, driving, working 2 jobs in Ambulance person, and caring for her family     Hand Dominance   Dominant Hand: Right    Extremity/Trunk Assessment   Upper Extremity Assessment Upper Extremity Assessment: RUE deficits/detail (L UE WNL) RUE Deficits / Details: shoulder flexion 2+/5, unable to perform flexion past approx 60 degrees. Limited by pain. bicep/triceps grossly  3/5, however pain limiting, grip 4/5. Impaired sensation/coordination  RUE Sensation: decreased light touch RUE Coordination: decreased fine motor;decreased gross motor    Lower Extremity Assessment Lower Extremity Assessment: Overall WFL for tasks assessed       Communication    Communication: No difficulties  Cognition Arousal/Alertness: Awake/alert Behavior During Therapy: WFL for tasks assessed/performed Overall Cognitive Status: Within Functional Limits for tasks assessed                                        General Comments General comments (skin integrity, edema, etc.): Pt reports her R arm is less swollen this date    Exercises     Assessment/Plan    PT Assessment All further PT needs can be met in the next venue of care  PT Problem List Decreased strength;Pain       PT Treatment Interventions      PT Goals (Current goals can be found in the Care Plan section)  Acute Rehab PT Goals Patient Stated Goal: go home PT Goal Formulation: With patient Time For Goal Achievement: 05/24/17 Potential to Achieve Goals: Good    Frequency     Barriers to discharge        Co-evaluation               AM-PAC PT "6 Clicks" Daily Activity  Outcome Measure Difficulty turning over in bed (including adjusting bedclothes, sheets and blankets)?: None Difficulty moving from lying on back to sitting on the side of the bed? : None Difficulty sitting down on and standing up from a chair with arms (e.g., wheelchair, bedside commode, etc,.)?: None Help needed moving to and from a bed to chair (including a wheelchair)?: None Help needed walking in hospital room?: None Help needed climbing 3-5 steps with a railing? : None 6 Click Score: 24    End of Session   Activity Tolerance: Patient tolerated treatment well Patient left: in bed (MD in room) Nurse Communication: Mobility status PT Visit Diagnosis: Pain;Unsteadiness on feet (R26.81) Pain - Right/Left: Right Pain - part of body: Shoulder    Time: 3734-2876 PT Time Calculation (min) (ACUTE ONLY): 8 min   Charges:   PT Evaluation $PT Eval Low Complexity: 1 Low     PT G Codes:   PT G-Codes **NOT FOR INPATIENT CLASS** Functional Assessment Tool Used: AM-PAC 6 Clicks Basic  Mobility Functional Limitation: Mobility: Walking and moving around Mobility: Walking and Moving Around Current Status (O1157): 0 percent impaired, limited or restricted Mobility: Walking and Moving Around Goal Status (W6203): 0 percent impaired, limited or restricted Mobility: Walking and Moving Around Discharge Status (T5974): 0 percent impaired, limited or restricted    Greggory Stallion, PT, DPT [phone]   Nieshia Larmon 05/24/2017, 11:20 AM

## 2017-05-25 LAB — ANA W/REFLEX IF POSITIVE: Anti Nuclear Antibody(ANA): NEGATIVE

## 2017-05-25 NOTE — Discharge Summary (Signed)
Erin Curtis at [facility] NAME: Erin Curtis    MR#:  [id number]  DATE OF BIRTH:  1985-10-01  DATE OF ADMISSION:  05/22/2017 ADMITTING PHYSICIAN: Nicholes Mango, MD  DATE OF DISCHARGE: 05/24/2017 10:56 AM  PRIMARY CARE PHYSICIAN: [facility]   ADMISSION DIAGNOSIS:  Right arm weakness [R29.898] Chest pain, unspecified type [R07.9]  DISCHARGE DIAGNOSIS:  Active Problems:   TIA (transient ischemic attack)   SECONDARY DIAGNOSIS:   Past Medical History:  Diagnosis Date  . Depression      ADMITTING HISTORY  HISTORY OF PRESENT ILLNESS:  Erin Curtis  is a 31 y.o. female with a known history of chronic low back pain, depression, tobacco abuse disorder is presenting to the ED with a chief complaint of chest pain and right upper extremity weakness associated with tingling. Denies any headache or blurry vision. Denies any speech difficulty or swallowing problems.patient reports right-sided chest pressure radiating to the right arm. Initial troponin is negative. EKG is normal. CT head is normal.ED physician discussed with on-call neurologist who has recommended overnight observation to rule out stroke   HOSPITAL COURSE:   * Right upper extremity pain and weakness. Patient had MRI and MRA of the brain which were normal. This was followed by an MRI of the cervical spine which showed no significant changes. She also had x-ray of the shoulder to rule out any dislocation or fracture which resulted in a normal shoulder. Patient's pain improved after starting Flexeril. An ESR and AMA were checked which were normal. Patient likely has muscle pain at this time. All acute reasons have been ruled out. She will follow up as outpatient with her primary care physician and neurology.  Stable for discharge home.  CONSULTS OBTAINED:  Treatment Team:  Alexis Goodell, MD  DRUG ALLERGIES:   Allergies  Allergen Reactions  . Vicodin  [Hydrocodone-Acetaminophen] Itching    DISCHARGE MEDICATIONS:   Discharge Medication List as of 05/24/2017 10:34 AM    START taking these medications   Details  cyclobenzaprine (FLEXERIL) 10 MG tablet Take 1 tablet (10 mg total) by mouth 3 (three) times daily as needed for muscle spasms., Starting Mon 05/23/2017, Print        Today   VITAL SIGNS:  Blood pressure (!) 102/53, pulse (!) 48, temperature 98.6 F (37 C), resp. rate 20, height _0  (1.727 m), weight 74.5 kg (164 lb 3.2 oz), SpO2 98 %, unknown if currently breastfeeding.  I/O:  No intake or output data in the 24 hours ending 05/25/17 1326  PHYSICAL EXAMINATION:  Physical Exam  GENERAL:  31 y.o.-year-old patient lying in the bed with no acute distress.  LUNGS: Normal breath sounds bilaterally, no wheezing, rales,rhonchi or crepitation. No use of accessory muscles of respiration.  CARDIOVASCULAR: S1, S2 normal. No murmurs, rubs, or gallops.  ABDOMEN: Soft, non-tender, non-distended. Bowel sounds present. No organomegaly or mass.  NEUROLOGIC: Moves all 4 extremities. PSYCHIATRIC: The patient is alert and oriented x 3.  SKIN: No obvious rash, lesion, or ulcer.   DATA REVIEW:   CBC  Recent Labs Lab 05/22/17 1212  WBC 4.9  HGB 14.3  HCT 41.6  PLT 320    Chemistries   Recent Labs Lab 05/22/17 1212  NA 138  K 3.8  CL 107  CO2 24  GLUCOSE 89  BUN 17  CREATININE 0.70  CALCIUM 9.4    Cardiac Enzymes  Recent Labs Lab 05/23/17 0241  TROPONINI <0.03  Microbiology Results  Results for orders placed or performed during the hospital encounter of 08/31/15  Wet prep, genital     Status: Abnormal   Collection Time: 08/31/15  1:51 PM  Result Value Ref Range Status   Yeast Wet Prep HPF POC NONE SEEN NONE SEEN Final   Trich, Wet Prep NONE SEEN NONE SEEN Final   Clue Cells Wet Prep HPF POC NONE SEEN NONE SEEN Final   WBC, Wet Prep HPF POC FEW (A) NONE SEEN Final   Sperm NONE SEEN  Final  Chlamydia/NGC  rt PCR     Status: None   Collection Time: 08/31/15  1:51 PM  Result Value Ref Range Status   Specimen source GC/Chlam ENDOCERVICAL  Final   Chlamydia Tr NOT DETECTED NOT DETECTED Final   N gonorrhoeae NOT DETECTED NOT DETECTED Final    Comment: (NOTE) 100  This methodology has not been evaluated in pregnant women or in 200  patients with a history of hysterectomy. 300 400  This methodology will not be performed on patients less than 71  years of age.   Urine culture     Status: None   Collection Time: 08/31/15  2:17 PM  Result Value Ref Range Status   Specimen Description URINE, RANDOM  Final   Special Requests NONE  Final   Culture   Final    80,000 COLONIES/ml STAPHYLOCOCCUS AUREUS INDUCIBLE CLINDAMYCIN RESISTANCE - A positive ICR test is indicative of inducible resistance to macrolides, lincosamides, and type B streptogramin.  This isolate is presumed to be resistant to Clindamycin, however, Clindamycin may still be effective in  some patients.   WITH WITH MIXED BACTERIAL ORGANISMS    Report Status 09/05/2015 FINAL  Final   Organism ID, Bacteria STAPHYLOCOCCUS AUREUS  Final      Susceptibility   Staphylococcus aureus - MIC*    CIPROFLOXACIN <=0.5 SENSITIVE Sensitive     ERYTHROMYCIN >=8 RESISTANT Resistant     GENTAMICIN <=0.5 SENSITIVE Sensitive     OXACILLIN 2 SENSITIVE Sensitive     TRIMETH/SULFA <=10 SENSITIVE Sensitive     CLINDAMYCIN <=0.25 RESISTANT Resistant     CEFOXITIN SCREEN NEGATIVE Sensitive     Inducible Clindamycin Value in next row Resistant      POSITIVEINDUCIBLE CLINDAMYCIN RESISTANCE - A positive ICR test is indicative of inducible resistance to macrolides, lincosamides, and type B streptogramin.  This isolate is presumed to be resistant to Clindamycin, however, Clindamycin may still be effective in some patients.     NITROFURANTOIN Value in next row Sensitive      SENSITIVE<=16    * 80,000 COLONIES/ml STAPHYLOCOCCUS AUREUS    RADIOLOGY:  Dg  Shoulder Right  Result Date: 05/23/2017 CLINICAL DATA:  Right shoulder pain.  No known injury. EXAM: RIGHT SHOULDER - 2+ VIEW COMPARISON:  No recent. FINDINGS: No acute bony joint abnormality identified.  No dislocation . IMPRESSION: No acute or focal abnormality Electronically Signed   By: Marcello Moores  Register   On: 05/23/2017 14:53   Mr Cervical Spine Wo Contrast  Result Date: 05/23/2017 CLINICAL DATA:  Upper extremity weakness.  Followup code stroke. EXAM: MRI CERVICAL SPINE WITHOUT CONTRAST TECHNIQUE: Multiplanar, multisequence MR imaging of the cervical spine was performed. No intravenous contrast was administered. COMPARISON:  MRI of the head May 23, 2017 at 0922 hours FINDINGS: ALIGNMENT: Straightened cervical lordosis.  No malalignment. VERTEBRAE/DISCS: Vertebral bodies are intact. Intervertebral disc morphology's and signal are normal. No abnormal or acute bone marrow signal. CORD:Cervical spinal cord is  normal morphology and signal characteristics from the cervicomedullary junction to level of T1-2, the most caudal well visualized level. POSTERIOR FOSSA, VERTEBRAL ARTERIES, PARASPINAL TISSUES: No MR findings of ligamentous injury. Vertebral artery flow voids present. Included posterior fossa and paraspinal soft tissues are normal. DISC LEVELS: C2-3 through C5-6: No disc bulge, canal stenosis nor neural foraminal narrowing. C6-7: Tiny RIGHT central disc protrusion without canal stenosis or neural foraminal narrowing. C7-T1: No disc bulge, canal stenosis nor neural foraminal narrowing. IMPRESSION: 1. Tiny C6-7 disc protrusion without canal stenosis or neural foraminal narrowing any level. Electronically Signed   By: Elon Alas M.D.   On: 05/23/2017 21:08    Follow up with PCP in 1 week.  Management plans discussed with the patient, family and they are in agreement.  CODE STATUS:  Code Status History    Date Active Date Inactive Code Status Order ID Comments User Context   05/22/2017   4:07 PM 05/24/2017  2:01 PM Full Code [id number]  Nicholes Mango, MD Inpatient   01/30/2016  3:19 AM 01/31/2016  7:22 PM Full Code [id number]  Schermerhorn, Gwen Her, MD Inpatient   01/29/2016 10:29 PM 01/30/2016  3:19 AM Full Code [id number]  Schermerhorn, Gwen Her, MD Inpatient   12/31/2015  9:10 AM 12/31/2015  2:59 PM Full Code [id number]  Melvenia Beam, RN Inpatient   11/19/2015  7:41 PM 11/19/2015 11:12 PM Full Code [id number]  Catheryn Bacon, CNM Inpatient      TOTAL TIME TAKING CARE OF THIS PATIENT ON DAY OF DISCHARGE: more than 30 minutes.   Hillary Bow R M.D on 05/25/2017 at 1:26 PM  Between 7am to 6pm - Pager - [phone]  After 6pm go to [email or web address] - password [email or web address] [facility]  Office  [phone]  CC: Primary care physician; [facility]  Note: This dictation was prepared with Dragon dictation along with smaller phrase technology. Any transcriptional errors that result from this process are unintentional.

## 2018-10-02 ENCOUNTER — Encounter: Payer: Self-pay | Admitting: Emergency Medicine

## 2018-10-02 ENCOUNTER — Other Ambulatory Visit: Payer: Self-pay

## 2018-10-02 ENCOUNTER — Emergency Department
Admission: EM | Admit: 2018-10-02 | Discharge: 2018-10-02 | Disposition: A | Discharge status: Home / Self Care | Payer: Medicaid Other | Attending: Emergency Medicine | Admitting: Emergency Medicine

## 2018-10-02 ENCOUNTER — Emergency Department: Payer: Medicaid Other

## 2018-10-02 DIAGNOSIS — Z8673 Personal history of transient ischemic attack (TIA), and cerebral infarction without residual deficits: Secondary | ICD-10-CM | POA: Insufficient documentation

## 2018-10-02 DIAGNOSIS — M5412 Radiculopathy, cervical region: Secondary | ICD-10-CM

## 2018-10-02 DIAGNOSIS — F17210 Nicotine dependence, cigarettes, uncomplicated: Secondary | ICD-10-CM | POA: Insufficient documentation

## 2018-10-02 DIAGNOSIS — F329 Major depressive disorder, single episode, unspecified: Secondary | ICD-10-CM | POA: Insufficient documentation

## 2018-10-02 LAB — BASIC METABOLIC PANEL
ANION GAP: 5 (ref 5–15)
BUN: 21 mg/dL — ABNORMAL HIGH (ref 6–20)
CALCIUM: 8.9 mg/dL (ref 8.9–10.3)
CHLORIDE: 105 mmol/L (ref 98–111)
CO2: 28 mmol/L (ref 22–32)
Creatinine, Ser: 0.64 mg/dL (ref 0.44–1.00)
GFR calc non Af Amer: >60 mL/min (ref >60)
Glucose, Bld: 102 mg/dL — ABNORMAL HIGH (ref 70–99)
Potassium: 3.3 mmol/L — ABNORMAL LOW (ref 3.5–5.1)
Sodium: 138 mmol/L (ref 135–145)

## 2018-10-02 LAB — TROPONIN I: Troponin I: <0.03 ng/mL (ref <0.03)

## 2018-10-02 LAB — CBC
HCT: 37.1 % (ref 36.0–46.0)
Hemoglobin: 12.4 g/dL (ref 12.0–15.0)
MCH: 32.3 pg (ref 26.0–34.0)
MCHC: 33.4 g/dL (ref 30.0–36.0)
MCV: 96.6 fL (ref 80.0–100.0)
NRBC: 0.8 % — AB (ref 0.0–0.2)
Platelets: 304 10*3/uL (ref 150–400)
RBC: 3.84 MIL/uL — ABNORMAL LOW (ref 3.87–5.11)
RDW: 12.6 % (ref 11.5–15.5)
WBC: 3.6 10*3/uL — AB (ref 4.0–10.5)

## 2018-10-02 LAB — POCT PREGNANCY, URINE: Preg Test, Ur: NEGATIVE

## 2018-10-02 MED ORDER — NAPROXEN 500 MG PO TABS: 500.0000 mg | ORAL_TABLET | Freq: Two times a day (BID) | ORAL | 2 refills | Status: DC

## 2018-10-02 MED ORDER — KETOROLAC TROMETHAMINE 30 MG/ML IJ SOLN
30.0000 mg | Freq: Once | INTRAMUSCULAR | Status: AC
  Administered 2018-10-02: 30 mg via INTRAMUSCULAR
  Filled 2018-10-02: qty 1

## 2018-10-02 MED ORDER — METHOCARBAMOL 500 MG PO TABS: 500.0000 mg | ORAL_TABLET | Freq: Three times a day (TID) | ORAL | 0 refills | Status: DC

## 2018-10-02 NOTE — ED Triage Notes (Signed)
Pt presents to ED via POV with c/o L sided chest pain that radiates to L arm, states worsened over night.

## 2018-10-02 NOTE — ED Provider Notes (Signed)
[facility] Emergency Department Provider Note   ____________________________________________    I have reviewed the triage vital signs and the nursing notes.   HISTORY  Chief Complaint Chest and arm pain   HPI Erin Curtis is a 33 y.o. female who presents with complaints of chest and arm pain.  Patient describes tingling sensation in her left arm with pain in her left shoulder/neck.  She also reports some pain radiating down into her left upper chest.  Denies shortness of breath.  No fevers or chills.  She reports this is happened in the past.  At that time was admitted to the hospital and had MRIs and MRA all of which were entirely normal.  She does not take anything for this.  No weakness.  Past Medical History:  Diagnosis Date  . Depression     Patient Active Problem List   Diagnosis Date Noted  . TIA (transient ischemic attack) 05/22/2017  . Active labor at term 01/29/2016  . Premature uterine contractions 12/31/2015  . Indication for care in labor and delivery, antepartum 11/19/2015  . Pelvic pressure in pregnancy 11/19/2015  . Abdominal pain affecting pregnancy 09/29/2015  . First trimester screening 08/14/2015  . Hyperemesis affecting pregnancy, antepartum 08/14/2015  . Low serum thyroid stimulating hormone (TSH) 08/14/2015  . History of pregnancy loss in prior pregnancy, currently pregnant 08/14/2015  . Back pain complicating pregnancy 08/14/2015    Past Surgical History:  Procedure Laterality Date  . LAPAROSCOPIC TUBAL LIGATION Bilateral 07/02/2016   Procedure: LAPAROSCOPIC TUBAL LIGATION;  Surgeon: Suzy Bouchardhomas J Schermerhorn, MD;  Location: [facility] ORS;  Service: Gynecology;  Laterality: Bilateral;  . NO PAST SURGERIES      Prior to Admission medications   Medication Sig Start Date End Date Taking? Authorizing Provider  cyclobenzaprine (FLEXERIL) 10 MG tablet Take 1 tablet (10 mg total) by mouth 3 (three) times daily as needed for  muscle spasms. 05/23/17   Milagros LollSudini, Srikar, MD  methocarbamol (ROBAXIN) 500 MG tablet Take 1 tablet (500 mg total) by mouth 3 (three) times daily. 10/02/18   Jene EveryKinner, Willian Donson, MD  naproxen (NAPROSYN) 500 MG tablet Take 1 tablet (500 mg total) by mouth 2 (two) times daily with a meal. 10/02/18   Jene EveryKinner, Uthman Mroczkowski, MD     Allergies Vicodin [hydrocodone-acetaminophen]  History reviewed. No pertinent family history.  Social History Social History   Tobacco Use  . Smoking status: Current Every Day Smoker    Packs/day: 0.25    Years: 9.00    Pack years: 2.25    Types: Cigarettes  . Smokeless tobacco: Never Used  Substance Use Topics  . Alcohol use: Yes    Comment: socially  . Drug use: Yes    Types: Marijuana    Comment: PT DENIES DURING PHONE INTERVIEW ON 06-28-16 BUT PT HAD +UDS ON 01-2016 FOR MARIJUANA    Review of Systems  Constitutional: No fever/chills Eyes: No visual changes.  ENT: No sore throat. Cardiovascular: As above Respiratory: Denies shortness of breath. Gastrointestinal: No abdominal pain.  No nausea, no vomiting.   Genitourinary: Negative for dysuria. Musculoskeletal: As above Skin: Negative for rash. Neurological: Negative for headaches or weakness   ____________________________________________   PHYSICAL EXAM:  VITAL SIGNS: ED Triage Vitals  Enc Vitals Group     BP 10/02/18 1218 (!) 126/50     Pulse Rate 10/02/18 1218 63     Resp --      Temp 10/02/18 1218 97.6 F (36.4 C)  Temp Source 10/02/18 1218 Oral     SpO2 10/02/18 1218 95 %     Weight 10/02/18 1211 70.8 kg (156 lb)     Height 10/02/18 1211 1.727 m (5\' 8" )     Head Circumference --      Peak Flow --      Pain Score 10/02/18 1211 8     Pain Loc --      Pain Edu? --      Excl. in [place]? --     Constitutional: Alert and oriented.  Eyes: Conjunctivae are normal.   Nose: No congestion/rhinnorhea. Mouth/Throat: Mucous membranes are moist.   Neck: No vertebral tenderness to palpation, patient  with significant muscle spasm to the left trapezius which is quite tender to palpation Cardiovascular: Normal rate, regular rhythm. Grossly normal heart sounds.  Good peripheral circulation. Respiratory: Normal respiratory effort.  No retractions. Lungs CTAB. Gastrointestinal: Soft and nontender. No distention.  No CVA tenderness. Genitourinary: deferred Musculoskeletal: Upper extremities are warm and well perfused.  Normal strength in both upper extremities. Neurologic:  Normal speech and language. No gross focal neurologic deficits are appreciated.   Skin:  Skin is warm, dry and intact. No rash noted. Psychiatric: Mood and affect are normal. Speech and behavior are normal.  ____________________________________________   LABS (all labs ordered are listed, but only abnormal results are displayed)  Labs Reviewed  BASIC METABOLIC PANEL - Abnormal; Notable for the following components:      Result Value   Potassium 3.3 (*)    Glucose, Bld 102 (*)    BUN 21 (*)    All other components within normal limits  CBC - Abnormal; Notable for the following components:   WBC 3.6 (*)    RBC 3.84 (*)    nRBC 0.8 (*)    All other components within normal limits  TROPONIN I  POC URINE PREG, ED  POCT PREGNANCY, URINE   ____________________________________________  EKG  ED ECG REPORT I, Jene Everyobert Kimberla Driskill, the attending physician, personally viewed and interpreted this ECG.  Date: 10/02/2018  Rhythm: normal sinus rhythm QRS Axis: normal Intervals: normal ST/T Wave abnormalities: normal Narrative Interpretation: no evidence of acute ischemia  ____________________________________________  RADIOLOGY  X-ray normal ____________________________________________   PROCEDURES  Procedure(s) performed: No  Procedures   Critical Care performed: No ____________________________________________   INITIAL IMPRESSION / ASSESSMENT AND PLAN / ED COURSE  Pertinent labs & imaging results that  were available during my care of the patient were reviewed by me and considered in my medical decision making (see chart for details).  Patient overall well-appearing in no acute distress.  Lab work is unremarkable, troponin normal, EKG unremarkable.  Chest x-ray normal.  Very low risk for ACS, no evidence of pericarditis, no pleurisy calf pain or swelling or hemoptysis.  She has significant tenderness of the left trapezius muscle, suspect inflammation in this area is causing tingling symptoms in the left arm/cervical radiculopathy will treat with steroids, close outpatient follow-up.  Return precautions discussed    ____________________________________________   FINAL CLINICAL IMPRESSION(S) / ED DIAGNOSES  Final diagnoses:  Cervical radiculopathy        Note:  This document was prepared using Dragon voice recognition software and may include unintentional dictation errors.   Jene EveryKinner, Shaneque Merkle, MD 10/02/18 [phone]

## 2019-05-28 ENCOUNTER — Other Ambulatory Visit: Payer: Self-pay

## 2019-05-28 ENCOUNTER — Emergency Department
Admission: EM | Admit: 2019-05-28 | Discharge: 2019-05-28 | Disposition: A | Discharge status: Home / Self Care | Payer: Medicaid Other | Attending: Emergency Medicine | Admitting: Emergency Medicine

## 2019-05-28 ENCOUNTER — Emergency Department: Payer: Medicaid Other

## 2019-05-28 DIAGNOSIS — R0789 Other chest pain: Secondary | ICD-10-CM

## 2019-05-28 DIAGNOSIS — F17210 Nicotine dependence, cigarettes, uncomplicated: Secondary | ICD-10-CM | POA: Insufficient documentation

## 2019-05-28 DIAGNOSIS — K219 Gastro-esophageal reflux disease without esophagitis: Secondary | ICD-10-CM

## 2019-05-28 LAB — CBC
HCT: 40.1 % (ref 36.0–46.0)
Hemoglobin: 13.6 g/dL (ref 12.0–15.0)
MCH: 32.2 pg (ref 26.0–34.0)
MCHC: 33.9 g/dL (ref 30.0–36.0)
MCV: 95 fL (ref 80.0–100.0)
Platelets: 301 10*3/uL (ref 150–400)
RBC: 4.22 MIL/uL (ref 3.87–5.11)
RDW: 12.2 % (ref 11.5–15.5)
WBC: 5.7 10*3/uL (ref 4.0–10.5)
nRBC: 0 % (ref 0.0–0.2)

## 2019-05-28 LAB — BASIC METABOLIC PANEL
Anion gap: 7 (ref 5–15)
BUN: 21 mg/dL — ABNORMAL HIGH (ref 6–20)
CO2: 25 mmol/L (ref 22–32)
Calcium: 9.2 mg/dL (ref 8.9–10.3)
Chloride: 104 mmol/L (ref 98–111)
Creatinine, Ser: 0.7 mg/dL (ref 0.44–1.00)
GFR calc Af Amer: >60 mL/min (ref >60)
GFR calc non Af Amer: >60 mL/min (ref >60)
Glucose, Bld: 99 mg/dL (ref 70–99)
Potassium: 3.8 mmol/L (ref 3.5–5.1)
Sodium: 136 mmol/L (ref 135–145)

## 2019-05-28 LAB — TROPONIN I (HIGH SENSITIVITY): Troponin I (High Sensitivity): <2 ng/L (ref <18)

## 2019-05-28 MED ORDER — LIDOCAINE VISCOUS HCL 2 % MT SOLN
15.0000 mL | Freq: Once | OROMUCOSAL | Status: AC
  Administered 2019-05-28: 08:00:00 15 mL via ORAL
  Filled 2019-05-28: qty 15

## 2019-05-28 MED ORDER — PANTOPRAZOLE SODIUM 20 MG PO TBEC: 20.0000 mg | DELAYED_RELEASE_TABLET | Freq: Every day | ORAL | 1 refills | Status: AC

## 2019-05-28 MED ORDER — ALUM & MAG HYDROXIDE-SIMETH 200-200-20 MG/5ML PO SUSP
30.0000 mL | Freq: Once | ORAL | Status: AC
  Administered 2019-05-28: 08:00:00 30 mL via ORAL
  Filled 2019-05-28: qty 30

## 2019-05-28 NOTE — ED Notes (Signed)
This RN introduced self to pt. Pt states chest pain started two weeks ago but has gotten worse in the last day. Pt states she woke up vomiting and chest pain was the worse it has been. Pt in NAD. VSS. Will continue to monitor for further needs.

## 2019-05-28 NOTE — ED Triage Notes (Signed)
Patient reports chest pain off/on for couple weeks.  Would come then go.  Tonight woke her from sleep with nausea and has been constant.  Patient reports pain worse with deep breathing and feels heavy.

## 2019-05-28 NOTE — ED Provider Notes (Signed)
[facility] Emergency Department Provider Note   ____________________________________________    I have reviewed the triage vital signs and the nursing notes.   HISTORY  Chief Complaint Chest Pain     HPI Erin Curtis is a 33 y.o. female who presents with complaints of chest comfort.  She reports that for months now she has had intermittent burning sensation in her chest.  Upon questioning she does admit that it is worse at night, she states typically she eats about half an hour before going to bed.  Denies shortness of breath.  No pleurisy.  No calf pain or swelling.  No diaphoresis.  Occasionally feels nauseated from this.  Denies bad taste in her mouth.  No fevers or chills or cough  Past Medical History:  Diagnosis Date  . Depression     Patient Active Problem List   Diagnosis Date Noted  . TIA (transient ischemic attack) 05/22/2017  . Active labor at term 01/29/2016  . Premature uterine contractions 12/31/2015  . Indication for care in labor and delivery, antepartum 11/19/2015  . Pelvic pressure in pregnancy 11/19/2015  . Abdominal pain affecting pregnancy 09/29/2015  . First trimester screening 08/14/2015  . Hyperemesis affecting pregnancy, antepartum 08/14/2015  . Low serum thyroid stimulating hormone (TSH) 08/14/2015  . History of pregnancy loss in prior pregnancy, currently pregnant 08/14/2015  . Back pain complicating pregnancy 08/14/2015    Past Surgical History:  Procedure Laterality Date  . LAPAROSCOPIC TUBAL LIGATION Bilateral 07/02/2016   Procedure: LAPAROSCOPIC TUBAL LIGATION;  Surgeon: Suzy Bouchardhomas J Schermerhorn, MD;  Location: [facility] ORS;  Service: Gynecology;  Laterality: Bilateral;  . NO PAST SURGERIES      Prior to Admission medications   Medication Sig Start Date End Date Taking? Authorizing Provider  pantoprazole (PROTONIX) 20 MG tablet Take 1 tablet (20 mg total) by mouth daily. 05/28/19 05/27/20  Jene EveryKinner, Hayzel Ruberg, MD      Allergies Vicodin [hydrocodone-acetaminophen]  No family history on file.  Social History Social History   Tobacco Use  . Smoking status: Current Every Day Smoker    Packs/day: 0.25    Years: 9.00    Pack years: 2.25    Types: Cigarettes  . Smokeless tobacco: Never Used  Substance Use Topics  . Alcohol use: Yes    Comment: socially  . Drug use: Yes    Types: Marijuana    Comment: PT DENIES DURING PHONE INTERVIEW ON 06-28-16 BUT PT HAD +UDS ON 01-2016 FOR MARIJUANA    Review of Systems  Constitutional: No fever/chills Eyes: No visual changes.  ENT: No sore throat. Cardiovascular: As above Respiratory: Denies shortness of breath. Gastrointestinal: As above Genitourinary: Negative for dysuria. Musculoskeletal: Negative for back pain. Skin: Negative for rash. Neurological: Negative for headaches   ____________________________________________   PHYSICAL EXAM:  VITAL SIGNS: ED Triage Vitals  Enc Vitals Group     BP 05/28/19 0533 111/66     Pulse Rate 05/28/19 0533 (!) 57     Resp 05/28/19 0533 20     Temp 05/28/19 0533 97.7 F (36.5 C)     Temp Source 05/28/19 0533 Oral     SpO2 05/28/19 0533 96 %     Weight 05/28/19 0532 72.6 kg (160 lb)     Height 05/28/19 0532 1.727 m (5\' 8" )     Head Circumference --      Peak Flow --      Pain Score 05/28/19 0532 9     Pain Loc --  Pain Edu? --      Excl. in [place]? --     Constitutional: Alert and oriented. No acute distress. Pleasant and interactive Eyes: Conjunctivae are normal.  Head: Atraumatic. Nose: No congestion/rhinnorhea. Mouth/Throat: Mucous membranes are moist.   Neck:  Painless ROM Cardiovascular: Normal rate, regular rhythm. Grossly normal heart sounds.  Good peripheral circulation. Respiratory: Normal respiratory effort.  No retractions. Lungs CTAB. Gastrointestinal: Soft and nontender. No distention.  No CVA tenderness. Genitourinary: deferred Musculoskeletal: No lower extremity tenderness nor  edema.  Warm and well perfused Neurologic:  Normal speech and language. No gross focal neurologic deficits are appreciated.  Skin:  Skin is warm, dry and intact. No rash noted. Psychiatric: Mood and affect are normal. Speech and behavior are normal.  ____________________________________________   LABS (all labs ordered are listed, but only abnormal results are displayed)  Labs Reviewed  BASIC METABOLIC PANEL - Abnormal; Notable for the following components:      Result Value   BUN 21 (*)    All other components within normal limits  CBC  POC URINE PREG, ED  TROPONIN I (HIGH SENSITIVITY)  TROPONIN I (HIGH SENSITIVITY)   ____________________________________________  EKG  ED ECG REPORT I, Lavonia Drafts, the attending physician, personally viewed and interpreted this ECG.  Date: 05/28/2019  Rhythm: normal sinus rhythm QRS Axis: normal Intervals: normal ST/T Wave abnormalities: normal Narrative Interpretation: no evidence of acute ischemia  ____________________________________________  RADIOLOGY  Chest x-ray unremarkable ____________________________________________   PROCEDURES  Procedure(s) performed: No  Procedures   Critical Care performed: No ____________________________________________   INITIAL IMPRESSION / ASSESSMENT AND PLAN / ED COURSE  Pertinent labs & imaging results that were available during my care of the patient were reviewed by me and considered in my medical decision making (see chart for details).  Patient presents with months of burning intermittent chest discomfort, seems to be worse at night, seems to be worse after eating very suspicious for GERD.  EKG is quite normal, she is low risk for ACS, troponin is normal as well, chest x-ray is benign.  Lab work otherwise reassuring.  Will trial GI cocktail as she has some burning sensation right now anticipate discharge with PPIs  Patient had significant provement with GI cocktail, appropriate for  discharge at this time    ____________________________________________   FINAL CLINICAL IMPRESSION(S) / ED DIAGNOSES  Final diagnoses:  Atypical chest pain  Gastroesophageal reflux disease, esophagitis presence not specified        Note:  This document was prepared using Dragon voice recognition software and may include unintentional dictation errors.   Lavonia Drafts, MD 05/28/19 [phone]

## 2019-06-25 ENCOUNTER — Other Ambulatory Visit: Payer: Self-pay

## 2019-06-25 DIAGNOSIS — Z20822 Contact with and (suspected) exposure to covid-19: Secondary | ICD-10-CM

## 2019-06-27 LAB — NOVEL CORONAVIRUS, NAA: SARS-CoV-2, NAA: NOT DETECTED

## 2020-10-24 ENCOUNTER — Ambulatory Visit
Admission: RE | Admit: 2020-10-24 | Discharge: 2020-10-24 | Disposition: A | Discharge status: Home / Self Care | Payer: 59 | Source: Ambulatory Visit | Attending: Emergency Medicine | Admitting: Emergency Medicine

## 2020-10-24 ENCOUNTER — Other Ambulatory Visit: Payer: Self-pay

## 2020-10-24 VITALS — BP 115/58 | HR 66 | Temp 98.9°F | Resp 18 | Ht 68.0 in | Wt 155.0 lb

## 2020-10-24 DIAGNOSIS — B029 Zoster without complications: Secondary | ICD-10-CM

## 2020-10-24 DIAGNOSIS — M546 Pain in thoracic spine: Secondary | ICD-10-CM

## 2020-10-24 MED ORDER — VALACYCLOVIR HCL 1 G PO TABS
1000.0000 mg | ORAL_TABLET | Freq: Three times a day (TID) | ORAL | 0 refills | Status: AC
Start: 2020-10-24 — End: 2020-10-31

## 2020-10-24 MED ORDER — PREDNISONE 10 MG (21) PO TBPK: ORAL_TABLET | Freq: Every day | ORAL | 0 refills | Status: DC

## 2020-10-24 MED ORDER — CYCLOBENZAPRINE HCL 5 MG PO TABS: 5.0000 mg | ORAL_TABLET | Freq: Every day | ORAL | 0 refills | Status: DC

## 2020-10-24 NOTE — ED Provider Notes (Signed)
Erin Curtis    CSN: [id number] Arrival date & time: 10/24/20  1256      History   Chief Complaint Chief Complaint  Patient presents with  . Appointment  . Rash    HPI Erin GADBERRY is a 35 y.o. female.   Erin Curtis presents with complaints of left abdominal rash as well as left flank and back pain. Started 1 week ago. Rash has dried up and is improving. She had been applying hydrocortisone cream to it. She has had chicken pox as a child. No fevers. Work has been more stressful lately. No other known stressors     ROS per HPI, negative if not otherwise mentioned.      Past Medical History:  Diagnosis Date  . Depression     Patient Active Problem List   Diagnosis Date Noted  . TIA (transient ischemic attack) 05/22/2017  . Active labor at term 01/29/2016  . Premature uterine contractions 12/31/2015  . Indication for care in labor and delivery, antepartum 11/19/2015  . Pelvic pressure in pregnancy 11/19/2015  . Abdominal pain affecting pregnancy 09/29/2015  . First trimester screening 08/14/2015  . Hyperemesis affecting pregnancy, antepartum 08/14/2015  . Low serum thyroid stimulating hormone (TSH) 08/14/2015  . History of pregnancy loss in prior pregnancy, currently pregnant 08/14/2015  . Back pain complicating pregnancy 08/14/2015    Past Surgical History:  Procedure Laterality Date  . LAPAROSCOPIC TUBAL LIGATION Bilateral 07/02/2016   Procedure: LAPAROSCOPIC TUBAL LIGATION;  Surgeon: Suzy Bouchard, MD;  Location: [facility] ORS;  Service: Gynecology;  Laterality: Bilateral;  . NO PAST SURGERIES      OB History    Gravida  5   Para  4   Term  3   Preterm  1   AB  1   Living  3     SAB  1   IAB  0   Ectopic  0   Multiple  0   Live Births  4            Home Medications    Prior to Admission medications   Medication Sig Start Date End Date Taking? Authorizing Provider  cyclobenzaprine (FLEXERIL) 5 MG tablet Take 1  tablet (5 mg total) by mouth at bedtime. 10/24/20  Yes Zitlaly Malson, Dorene Grebe B, NP  predniSONE (STERAPRED UNI-PAK 21 TAB) 10 MG (21) TBPK tablet Take by mouth daily. Per box instruction 10/24/20  Yes Arlyn Buerkle, Dorene Grebe B, NP  valACYclovir (VALTREX) 1000 MG tablet Take 1 tablet (1,000 mg total) by mouth 3 (three) times daily for 7 days. 10/24/20 10/31/20 Yes Hye Trawick, Dorene Grebe B, NP  pantoprazole (PROTONIX) 20 MG tablet Take 1 tablet (20 mg total) by mouth daily. 05/28/19 05/27/20  Jene Every, MD    Family History No family history on file.  Social History Social History   Tobacco Use  . Smoking status: Current Every Day Smoker    Packs/day: 0.25    Years: 9.00    Pack years: 2.25    Types: Cigarettes  . Smokeless tobacco: Never Used  Substance Use Topics  . Alcohol use: Yes    Comment: socially  . Drug use: Yes    Types: Marijuana    Comment: PT DENIES DURING PHONE INTERVIEW ON 06-28-16 BUT PT HAD +UDS ON 01-2016 FOR MARIJUANA     Allergies   Vicodin [hydrocodone-acetaminophen]   Review of Systems Review of Systems   Physical Exam Triage Vital Signs ED Triage Vitals  Enc Vitals Group  BP 10/24/20 1306 (!) 115/58     Pulse Rate 10/24/20 1306 66     Resp 10/24/20 1306 18     Temp 10/24/20 1306 98.9 F (37.2 C)     Temp Source 10/24/20 1306 Oral     SpO2 10/24/20 1306 97 %     Weight 10/24/20 1307 155 lb (70.3 kg)     Height 10/24/20 1307 5\' 8"  (1.727 m)     Head Circumference --      Peak Flow --      Pain Score 10/24/20 1306 8     Pain Loc --      Pain Edu? --      Excl. in GC? --    No data found.  Updated Vital Signs BP (!) 115/58   Pulse 66   Temp 98.9 F (37.2 C) (Oral)   Resp 18   Ht 5\' 8"  (1.727 m)   Wt 155 lb (70.3 kg)   SpO2 97%   BMI 23.57 kg/m   Visual Acuity Right Eye Distance:   Left Eye Distance:   Bilateral Distance:    Right Eye Near:   Left Eye Near:    Bilateral Near:     Physical Exam Constitutional:      General: She is not in acute  distress.    Appearance: She is well-developed.  Cardiovascular:     Rate and Rhythm: Normal rate.  Pulmonary:     Effort: Pulmonary effort is normal.  Skin:    General: Skin is warm and dry.          Comments: Cluster of dried papular rash to left abdomen  Neurological:     Mental Status: She is alert and oriented to person, place, and time.      UC Treatments / Results  Labs (all labs ordered are listed, but only abnormal results are displayed) Labs Reviewed - No data to display  EKG   Radiology No results found.  Procedures Procedures (including critical care time)  Medications Ordered in UC Medications - No data to display  Initial Impression / Assessment and Plan / UC Course  I have reviewed the triage vital signs and the nursing notes.  Pertinent labs & imaging results that were available during my care of the patient were reviewed by me and considered in my medical decision making (see chart for details).     History and physical consistent with shingles. Pain management discussed, opted to provide valtrex, although symptoms started around a week ago. Return precautions provided. Patient verbalized understanding and agreeable to plan.   Final Clinical Impressions(s) / UC Diagnoses   Final diagnoses:  Herpes zoster without complication  Acute left-sided thoracic back pain     Discharge Instructions     This is consistent with shingles.  I have sent an antiviral as well as steroids to try to further help with your healing. Flexeril as needed as a muscle relaxer. May cause drowsiness. Please do not take if driving or drinking alcohol.  May be helpful before bed.  Sometimes shingles can cause long term pain to the affected nerve group.  Please establish with a primary care provider as you may need long term management of this.    ED Prescriptions    Medication Sig Dispense Auth. Provider   valACYclovir (VALTREX) 1000 MG tablet Take 1 tablet (1,000 mg  total) by mouth 3 (three) times daily for 7 days. 21 tablet 10/26/20 B, NP   predniSONE (STERAPRED  UNI-PAK 21 TAB) 10 MG (21) TBPK tablet Take by mouth daily. Per box instruction 21 tablet Linus Mako B, NP   cyclobenzaprine (FLEXERIL) 5 MG tablet Take 1 tablet (5 mg total) by mouth at bedtime. 15 tablet Georgetta Haber, NP     PDMP not reviewed this encounter.   Georgetta Haber, NP 10/24/20 1407

## 2020-10-24 NOTE — Discharge Instructions (Signed)
This is consistent with shingles.  I have sent an antiviral as well as steroids to try to further help with your healing. Flexeril as needed as a muscle relaxer. May cause drowsiness. Please do not take if driving or drinking alcohol.  May be helpful before bed.  Sometimes shingles can cause long term pain to the affected nerve group.  Please establish with a primary care provider as you may need long term management of this.

## 2020-10-24 NOTE — ED Triage Notes (Signed)
Pt reports having rash on L lower abd x1 week. Area is itchy. Pain in back on L side.

## 2021-05-28 ENCOUNTER — Emergency Department: Payer: 59

## 2021-05-28 ENCOUNTER — Other Ambulatory Visit: Payer: Self-pay

## 2021-05-28 ENCOUNTER — Encounter: Payer: Self-pay | Admitting: Emergency Medicine

## 2021-05-28 ENCOUNTER — Emergency Department
Admission: EM | Admit: 2021-05-28 | Discharge: 2021-05-28 | Disposition: A | Discharge status: Home / Self Care | Payer: 59 | Attending: Emergency Medicine | Admitting: Emergency Medicine

## 2021-05-28 DIAGNOSIS — R0789 Other chest pain: Secondary | ICD-10-CM | POA: Diagnosis not present

## 2021-05-28 DIAGNOSIS — R0602 Shortness of breath: Secondary | ICD-10-CM | POA: Insufficient documentation

## 2021-05-28 DIAGNOSIS — R110 Nausea: Secondary | ICD-10-CM | POA: Diagnosis not present

## 2021-05-28 DIAGNOSIS — R079 Chest pain, unspecified: Secondary | ICD-10-CM

## 2021-05-28 DIAGNOSIS — F17210 Nicotine dependence, cigarettes, uncomplicated: Secondary | ICD-10-CM | POA: Insufficient documentation

## 2021-05-28 DIAGNOSIS — R202 Paresthesia of skin: Secondary | ICD-10-CM | POA: Insufficient documentation

## 2021-05-28 LAB — BASIC METABOLIC PANEL
Anion gap: 8 (ref 5–15)
BUN: 21 mg/dL — ABNORMAL HIGH (ref 6–20)
CO2: 26 mmol/L (ref 22–32)
Calcium: 9.4 mg/dL (ref 8.9–10.3)
Chloride: 103 mmol/L (ref 98–111)
Creatinine, Ser: 0.73 mg/dL (ref 0.44–1.00)
GFR, Estimated: >60 mL/min (ref >60)
Glucose, Bld: 83 mg/dL (ref 70–99)
Potassium: 3.6 mmol/L (ref 3.5–5.1)
Sodium: 137 mmol/L (ref 135–145)

## 2021-05-28 LAB — CBC
HCT: 39.5 % (ref 36.0–46.0)
Hemoglobin: 13.8 g/dL (ref 12.0–15.0)
MCH: 33.9 pg (ref 26.0–34.0)
MCHC: 34.9 g/dL (ref 30.0–36.0)
MCV: 97.1 fL (ref 80.0–100.0)
Platelets: 329 10*3/uL (ref 150–400)
RBC: 4.07 MIL/uL (ref 3.87–5.11)
RDW: 12.4 % (ref 11.5–15.5)
WBC: 4.4 10*3/uL (ref 4.0–10.5)
nRBC: 0 % (ref 0.0–0.2)

## 2021-05-28 LAB — TROPONIN I (HIGH SENSITIVITY): Troponin I (High Sensitivity): <2 ng/L (ref <18)

## 2021-05-28 MED ORDER — IBUPROFEN 400 MG PO TABS
400.0000 mg | ORAL_TABLET | Freq: Once | ORAL | Status: AC
  Administered 2021-05-28: 400 mg via ORAL
  Filled 2021-05-28: qty 1

## 2021-05-28 MED ORDER — ONDANSETRON 4 MG PO TBDP
4.0000 mg | ORAL_TABLET | Freq: Once | ORAL | Status: AC
  Administered 2021-05-28: 4 mg via ORAL
  Filled 2021-05-28: qty 1

## 2021-05-28 NOTE — Discharge Instructions (Addendum)
Your EKG, chest x-ray and blood work were all reassuring today.  If you continue to have symptoms, please follow-up with a primary care provider.

## 2021-05-28 NOTE — ED Triage Notes (Signed)
Pt comes into the ED via POV c/o central chest pain that radiates into her back and under the left breast.  Pt states she was working when it happened.  Pt denies any SHOB, nausea, or dizziness.  Pt denies any known cardiac history.  Pt in NAD with even and unlabored respirations.

## 2021-05-28 NOTE — ED Provider Notes (Signed)
[facility]  ____________________________________________   Event Date/Time   First MD Initiated Contact with Patient 05/28/21 2037     (approximate)  I have reviewed the triage vital signs and the nursing notes.   HISTORY  Chief Complaint Chest Pain    HPI Erin Curtis is a 35 y.o. female with past medical history of depression who presents with chest pain.  Symptoms started today.  She endorses pressure along the left breast into her left shoulder, associated with some intermittent left arm numbness.  She has some associated shortness of breath as well as nausea but denies any vomiting or diaphoresis.  She denies history of similar.  Denies fevers chills cough or abdominal pain.  She denies family history of early coronary disease however some family members have had strokes at young ages.  The patient denies hx of prior DVT/PE, unilateral leg pain/swelling, hormone use, recent surgery, hx of cancer, prolonged immobilization, or hemoptysis.           Past Medical History:  Diagnosis Date   Depression     Patient Active Problem List   Diagnosis Date Noted   TIA (transient ischemic attack) 05/22/2017   Active labor at term 01/29/2016   Premature uterine contractions 12/31/2015   Indication for care in labor and delivery, antepartum 11/19/2015   Pelvic pressure in pregnancy 11/19/2015   Abdominal pain affecting pregnancy 09/29/2015   First trimester screening 08/14/2015   Hyperemesis affecting pregnancy, antepartum 08/14/2015   Low serum thyroid stimulating hormone (TSH) 08/14/2015   History of pregnancy loss in prior pregnancy, currently pregnant 08/14/2015   Back pain complicating pregnancy 08/14/2015    Past Surgical History:  Procedure Laterality Date   LAPAROSCOPIC TUBAL LIGATION Bilateral 07/02/2016   Procedure: LAPAROSCOPIC TUBAL LIGATION;  Surgeon: Suzy Bouchard, MD;  Location: [facility] ORS;  Service: Gynecology;  Laterality:  Bilateral;   NO PAST SURGERIES      Prior to Admission medications   Medication Sig Start Date End Date Taking? Authorizing Provider  cyclobenzaprine (FLEXERIL) 5 MG tablet Take 1 tablet (5 mg total) by mouth at bedtime. 10/24/20   Georgetta Haber, NP  pantoprazole (PROTONIX) 20 MG tablet Take 1 tablet (20 mg total) by mouth daily. 05/28/19 05/27/20  Jene Every, MD  predniSONE (STERAPRED UNI-PAK 21 TAB) 10 MG (21) TBPK tablet Take by mouth daily. Per box instruction 10/24/20   Georgetta Haber, NP    Allergies Vicodin [hydrocodone-acetaminophen]  History reviewed. No pertinent family history.  Social History Social History   Tobacco Use   Smoking status: Every Day    Packs/day: 0.25    Years: 9.00    Pack years: 2.25    Types: Cigarettes   Smokeless tobacco: Never  Substance Use Topics   Alcohol use: Yes    Comment: socially   Drug use: Yes    Types: Marijuana    Comment: PT DENIES DURING PHONE INTERVIEW ON 06-28-16 BUT PT HAD +UDS ON 01-2016 FOR MARIJUANA    Review of Systems   Review of Systems  Constitutional:  Negative for diaphoresis.  HENT:  Negative for congestion.   Respiratory:  Positive for chest tightness and shortness of breath.   Cardiovascular:  Positive for chest pain.  Gastrointestinal:  Negative for abdominal pain, nausea and vomiting.  Genitourinary:  Positive for genital sores.  Neurological:  Negative for weakness.  All other systems reviewed and are negative.  Physical Exam Updated Vital Signs BP 134/89   Pulse 66  Temp (!) 97.5 F (36.4 C) (Oral)   Resp 19   Ht 5\' 8"  (1.727 m)   Wt 70.3 kg   LMP 05/28/2021   SpO2 99%   BMI 23.57 kg/m   Physical Exam Vitals and nursing note reviewed.  Constitutional:      General: She is not in acute distress.    Appearance: Normal appearance.  HENT:     Head: Normocephalic and atraumatic.  Eyes:     General: No scleral icterus.    Conjunctiva/sclera: Conjunctivae normal.  Cardiovascular:      Heart sounds: Normal heart sounds.  Pulmonary:     Effort: Pulmonary effort is normal. No respiratory distress.     Breath sounds: Normal breath sounds. No stridor.  Musculoskeletal:        General: No deformity or signs of injury.     Cervical back: Normal range of motion.     Right lower leg: No edema.     Left lower leg: No edema.  Skin:    General: Skin is dry.     Coloration: Skin is not jaundiced or pale.  Neurological:     General: No focal deficit present.     Mental Status: She is alert and oriented to person, place, and time. Mental status is at baseline.  Psychiatric:        Mood and Affect: Mood normal.        Behavior: Behavior normal.     LABS (all labs ordered are listed, but only abnormal results are displayed)  Labs Reviewed  BASIC METABOLIC PANEL - Abnormal; Notable for the following components:      Result Value   BUN 21 (*)    All other components within normal limits  CBC  POC URINE PREG, ED  TROPONIN I (HIGH SENSITIVITY)   ____________________________________________  EKG  Normal sinus rhythm, normal intervals normal axis no acute ischemic changes ____________________________________________  RADIOLOGY 07/28/2021, personally viewed and evaluated these images (plain radiographs) as part of my medical decision making, as well as reviewing the written report by the radiologist.  ED MD interpretation: I reviewed the chest x-ray which does not show any acute cardiopulmonary process    ____________________________________________   PROCEDURES  Procedure(s) performed (including Critical Care):  Procedures   ____________________________________________   INITIAL IMPRESSION / ASSESSMENT AND PLAN / ED COURSE     35 year old female who presents with chest pain and dyspnea.  Vital signs within normal limits.  She is well-appearing.  Her EKG is nonischemic and her troponin is negative.  Symptoms have been going on for greater than 3  hours.  She is PERC negative and has no risk factors for PE and my suspicion is low given her normal vital signs.  Chest x-ray also without infiltrate.  Will treat with NSAIDs.  We discussed return precautions as well as primary care follow-up.      ____________________________________________   FINAL CLINICAL IMPRESSION(S) / ED DIAGNOSES  Final diagnoses:  Chest pain, unspecified type     ED Discharge Orders     None        Note:  This document was prepared using Dragon voice recognition software and may include unintentional dictation errors.    31, MD 05/28/21 [phone]

## 2021-05-28 NOTE — ED Notes (Signed)
Attending provider messaged per pt c/o nausea. Awaiting new orders. Pt given crackers and sprite.

## 2022-06-08 ENCOUNTER — Emergency Department
Admission: EM | Admit: 2022-06-08 | Discharge: 2022-06-08 | Disposition: A | Discharge status: Home / Self Care | Payer: Medicaid Other | Attending: Emergency Medicine | Admitting: Emergency Medicine

## 2022-06-08 ENCOUNTER — Emergency Department: Payer: Medicaid Other

## 2022-06-08 ENCOUNTER — Encounter: Payer: Self-pay | Admitting: Emergency Medicine

## 2022-06-08 ENCOUNTER — Other Ambulatory Visit: Payer: Self-pay

## 2022-06-08 DIAGNOSIS — Z8673 Personal history of transient ischemic attack (TIA), and cerebral infarction without residual deficits: Secondary | ICD-10-CM | POA: Insufficient documentation

## 2022-06-08 DIAGNOSIS — M25562 Pain in left knee: Secondary | ICD-10-CM | POA: Insufficient documentation

## 2022-06-08 MED ORDER — IBUPROFEN 600 MG PO TABS
600.0000 mg | ORAL_TABLET | Freq: Once | ORAL | Status: AC
  Administered 2022-06-08: 600 mg via ORAL
  Filled 2022-06-08: qty 1

## 2022-06-08 NOTE — Discharge Instructions (Addendum)
Your x-ray does not show any fracture, dislocation, or fluid within the joint.  You were given a knee brace to help with your pain.  Please follow-up with orthopedics, call them tomorrow to arrange an appointment.  You may continue to apply ice to the area, 20 minutes on, 20 minutes off. Use a thin towel between the ice and your skin so you do not injure your skin. Please return for any new, worsening, or change in symptoms or other concerns.

## 2022-06-08 NOTE — ED Provider Notes (Signed)
[facility] Provider Note    Event Date/Time   First MD Initiated Contact with Patient 06/08/22 1508     (approximate)   History   Knee Pain   HPI  Erin Curtis is a 36 y.o. female who presents today for evaluation of left-sided knee pain.  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  She reports that midway through her shift yesterday she began to have pain in her left knee.  She denies any injury.  She feels that the pain is in the front of her knee.  She has been able to walk, but has worsening pain with ambulation.  She feels a clicking and a popping in her knee.  No paresthesias.  No hip pain.  No weakness.  Patient Active Problem List   Diagnosis Date Noted   TIA (transient ischemic attack) 05/22/2017   Active labor at term 01/29/2016   Premature uterine contractions 12/31/2015   Indication for care in labor and delivery, antepartum 11/19/2015   Pelvic pressure in pregnancy 11/19/2015   Abdominal pain affecting pregnancy 09/29/2015   First trimester screening 08/14/2015   Hyperemesis affecting pregnancy, antepartum 08/14/2015   Low serum thyroid stimulating hormone (TSH) 08/14/2015   History of pregnancy loss in prior pregnancy, currently pregnant 08/14/2015   Back pain complicating pregnancy 08/14/2015          Physical Exam   Triage Vital Signs: ED Triage Vitals  Enc Vitals Group     BP 06/08/22 1416 128/61     Pulse Rate 06/08/22 1416 70     Resp 06/08/22 1416 18     Temp 06/08/22 1416 (!) 97.5 F (36.4 C)     Temp Source 06/08/22 1416 Oral     SpO2 06/08/22 1416 96 %     Weight 06/08/22 1417 160 lb (72.6 kg)     Height 06/08/22 1417 5\' 8"  (1.727 m)     Head Circumference --      Peak Flow --      Pain Score 06/08/22 1433 7     Pain Loc --      Pain Edu? --      Excl. in GC? --     Most recent vital signs: Vitals:   06/08/22 1416  BP: 128/61  Pulse: 70  Resp: 18  Temp: (!) 97.5  F (36.4 C)  SpO2: 96%    Physical Exam Vitals and nursing note reviewed.  Constitutional:      General: Awake and alert. No acute distress.    Appearance: Normal appearance. The patient is normal weight.  HENT:     Head: Normocephalic and atraumatic.     Mouth: Mucous membranes are moist.  Eyes:     General: PERRL. Normal EOMs        Right eye: No discharge.        Left eye: No discharge.     Conjunctiva/sclera: Conjunctivae normal.  Cardiovascular:     Rate and Rhythm: Normal rate and regular rhythm.     Pulses: Normal pulses.     Heart sounds: Normal heart sounds Pulmonary:     Effort: Pulmonary effort is normal. No respiratory distress.     Breath sounds: Normal breath sounds.  Abdominal:     Abdomen is soft. There is no abdominal tenderness. No rebound or guarding. No distention. Musculoskeletal:        General: No swelling. Normal range of motion.  Cervical back: Normal range of motion and neck supple.  Left knee: No deformity or rash.  Mild medial joint line tenderness. No patellar tenderness, no ballotment Warm and well perfused extremity with 2+ pedal pulses 5/5 strength to dorsiflexion and plantarflexion at the ankle with intact sensation throughout extremity Normal range of motion of the knee, with intact flexion and extension to active and passive range of motion. Extensor mechanism intact. No ligamentous laxity. Negative anterior/posterior drawer/negative lachman, pain elicited with mcmurrays No effusion or warmth Intact quadriceps, hamstring function, patellar tendon function Pelvis stable Full ROM of ankle without pain or swelling Foot warm and well perfused Skin:    General: Skin is warm and dry.     Capillary Refill: Capillary refill takes less than 2 seconds.     Findings: No rash.  Neurological:     Mental Status: The patient is awake and alert.      ED Results / Procedures / Treatments   Labs (all labs ordered are listed, but only abnormal  results are displayed) Labs Reviewed - No data to display   EKG     RADIOLOGY I independently reviewed and interpreted imaging and agree with radiologists findings.     PROCEDURES:  Critical Care performed:   Procedures   MEDICATIONS ORDERED IN ED: Medications  ibuprofen (ADVIL) tablet 600 mg (600 mg Oral Given 06/08/22 1559)     IMPRESSION / MDM / ASSESSMENT AND PLAN / ED COURSE  I reviewed the triage vital signs and the nursing notes.   Differential diagnosis includes, but is not limited to, effusion, sprain, contusion, dislocation, fracture, joint infection, tendon rupture. No evidence of neurological deficit or vascular compromise on exam.  Normal strength and sensation in her hip and ankle and knee.  Extensor mechanism is intact.  She has normal distal pulses, sensation intact throughout the leg.  No swelling to her calf, no calf tenderness to suggest DVT.  History of PE or DVT.  No pitting edema.  No trauma to suggest fracture/dislocation or tendon injury, no osseous injury noted on X-Ray. No deformity or obvious ligamentous laxity on exam. No constitutional symptoms or effusion or warmth or erythema to suggest septic joint.  She is able to range her knee, though has pain with doing so.  No effusion or ligamental laxity to suggest ligament injury.  She does report that she has a popping and a clicking, it is possible that she has a meniscus injury.  She was placed in a knee immobilizer and instructed to follow-up with orthopedics.  No history of immunosuppression. Overall well appearing, vital signs stable. No indication for diagnostic or therapeutic procedure such as arthrocentesis. Return precautions and care instructions discussed. Outpatient follow-up advised. Patient agrees with plan of care.   Patient's presentation is most consistent with acute complicated illness / injury requiring diagnostic workup.       FINAL CLINICAL IMPRESSION(S) / ED DIAGNOSES   Final  diagnoses:  Acute pain of left knee     Rx / DC Orders   ED Discharge Orders     None        Note:  This document was prepared using Dragon voice recognition software and may include unintentional dictation errors.   Keturah Shavers 06/08/22 1801    Chesley Noon, MD 06/08/22 2236

## 2022-06-08 NOTE — ED Triage Notes (Signed)
Pt arrives with c/o left knee swelling/pain that started yesterday. Pt denies injury

## 2022-12-14 ENCOUNTER — Ambulatory Visit
Admission: EM | Admit: 2022-12-14 | Discharge: 2022-12-14 | Disposition: A | Discharge status: Home / Self Care | Payer: Self-pay

## 2022-12-14 DIAGNOSIS — L84 Corns and callosities: Secondary | ICD-10-CM

## 2022-12-14 DIAGNOSIS — M79671 Pain in right foot: Secondary | ICD-10-CM

## 2022-12-14 DIAGNOSIS — M79674 Pain in right toe(s): Secondary | ICD-10-CM

## 2022-12-14 NOTE — ED Provider Notes (Signed)
Erin Curtis    CSN: [id number] Arrival date & time: 12/14/22  1625      History   Chief Complaint Chief Complaint  Patient presents with   Foot Pain    HPI Erin Curtis is a 37 y.o. female.  Patient presents with right toe pain x >1 month.  No falls or injury.  The pain starts in the 5th toe and radiates from her foot to her lower leg.  Her little toe has a painful hard dark area.  No open wounds, redness, bruising, swelling, numbness, weakness, or other symptoms.   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  No OTC medications taken today.    The history is provided by the patient and medical records.    Past Medical History:  Diagnosis Date   Depression     Patient Active Problem List   Diagnosis Date Noted   TIA (transient ischemic attack) 05/22/2017   Active labor at term 01/29/2016   Premature uterine contractions 12/31/2015   Indication for care in labor and delivery, antepartum 11/19/2015   Pelvic pressure in pregnancy 11/19/2015   Abdominal pain affecting pregnancy 09/29/2015   First trimester screening 08/14/2015   Hyperemesis affecting pregnancy, antepartum 08/14/2015   Low serum thyroid stimulating hormone (TSH) 08/14/2015   History of pregnancy loss in prior pregnancy, currently pregnant 08/14/2015   Back pain complicating pregnancy [id number]    Past Surgical History:  Procedure Laterality Date   LAPAROSCOPIC TUBAL LIGATION Bilateral 07/02/2016   Procedure: LAPAROSCOPIC TUBAL LIGATION;  Surgeon: Boykin Nearing, MD;  Location: [facility] ORS;  Service: Gynecology;  Laterality: Bilateral;   NO PAST SURGERIES      OB History     Gravida  5   Para  4   Term  3   Preterm  1   AB  1   Living  3      SAB  1   IAB  0   Ectopic  0   Multiple  0   Live Births  4            Home Medications    Prior to Admission medications   Medication Sig Start Date End Date Taking?  Authorizing Provider  pantoprazole (PROTONIX) 20 MG tablet Take 1 tablet (20 mg total) by mouth daily. 05/28/19 05/27/20  Lavonia Drafts, MD    Family History History reviewed. No pertinent family history.  Social History Social History   Tobacco Use   Smoking status: Every Day    Packs/day: 0.25    Years: 9.00    Additional pack years: 0.00    Total pack years: 2.25    Types: Cigarettes   Smokeless tobacco: Never  Substance Use Topics   Alcohol use: Yes    Comment: socially   Drug use: Yes    Types: Marijuana    Comment: PT DENIES DURING PHONE INTERVIEW ON 06-28-16 BUT PT HAD +UDS ON 01-2016 FOR MARIJUANA     Allergies   Vicodin [hydrocodone-acetaminophen]   Review of Systems Review of Systems  Constitutional:  Negative for chills and fever.  Musculoskeletal:  Positive for arthralgias and gait problem. Negative for joint swelling.  Skin:  Positive for color change. Negative for rash and wound.  Neurological:  Negative for weakness and numbness.  All other systems reviewed and are negative.    Physical Exam Triage Vital Signs ED Triage Vitals  Enc Vitals  Group     BP 12/14/22 1642 112/77     Pulse Rate 12/14/22 1637 90     Resp 12/14/22 1637 18     Temp 12/14/22 1637 97.8 F (36.6 C)     Temp src --      SpO2 12/14/22 1637 99 %     Weight --      Height --      Head Circumference --      Peak Flow --      Pain Score 12/14/22 1641 7     Pain Loc --      Pain Edu? --      Excl. in [place]? --    No data found.  Updated Vital Signs BP 112/77   Pulse 90   Temp 97.8 F (36.6 C)   Resp 18   LMP 12/12/2022   SpO2 99%   Visual Acuity Right Eye Distance:   Left Eye Distance:   Bilateral Distance:    Right Eye Near:   Left Eye Near:    Bilateral Near:     Physical Exam Vitals and nursing note reviewed.  Constitutional:      General: She is not in acute distress.    Appearance: Normal appearance. She is well-developed. She is not ill-appearing.  Eyes:      Conjunctiva/sclera: Conjunctivae normal.  Cardiovascular:     Rate and Rhythm: Normal rate and regular rhythm.  Pulmonary:     Effort: Pulmonary effort is normal. No respiratory distress.  Musculoskeletal:        General: Tenderness present. No swelling, deformity or signs of injury. Normal range of motion.     Cervical back: Neck supple.       Feet:  Skin:    General: Skin is warm and dry.     Capillary Refill: Capillary refill takes less than 2 seconds.     Findings: No bruising, erythema or lesion.  Neurological:     General: No focal deficit present.     Mental Status: She is alert and oriented to person, place, and time.     Sensory: No sensory deficit.     Motor: No weakness.     Gait: Gait normal.  Psychiatric:        Mood and Affect: Mood normal.        Behavior: Behavior normal.      UC Treatments / Results  Labs (all labs ordered are listed, but only abnormal results are displayed) Labs Reviewed - No data to display  EKG   Radiology No results found.  Procedures Procedures (including critical care time)  Medications Ordered in UC Medications - No data to display  Initial Impression / Assessment and Plan / UC Course  I have reviewed the triage vital signs and the nursing notes.  Pertinent labs & imaging results that were available during my care of the patient were reviewed by me and considered in my medical decision making (see chart for details).    Right toe and foot pain, corn on 5th toe.  Work note provided per patient request.  Discussed symptomatic treatment including OTC corn pads, Tylenol or ibuprofen. Instructed patient to follow up with a podiatrist.  Patient does not currently have a PCP; our nurse scheduled her with an appointment to establish with one.  Instructed patient to follow up with her new PCP as scheduled.  She agrees to plan of care.    Final Clinical Impressions(s) / UC Diagnoses   Final  diagnoses:  Toe pain, right  Right  foot pain  Corn of toe     Discharge Instructions      [facility] [address] [place], [place] [address] Phone: [phone]   Use corn pads as directed.  Take Tylenol or ibuprofen as needed for discomfort.  Follow up with a podiatrist.         ED Prescriptions   None    I have reviewed the PDMP during this encounter.   Sharion Balloon, NP 12/14/22 [phone]

## 2022-12-14 NOTE — ED Triage Notes (Addendum)
Patient to Urgent Care with complaints of right sided foot pain that started over one month ago. Reports pain is specifically in second and fourth toe that then radiates into her foot and leg. More discomfort when wearing shoes. 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   Denies any known injury.

## 2022-12-14 NOTE — Discharge Instructions (Addendum)
[facility] [address], [place], [place] [address] Phone: [phone]   Use corn pads as directed.  Take Tylenol or ibuprofen as needed for discomfort.  Follow up with a podiatrist.

## 2023-02-15 ENCOUNTER — Ambulatory Visit: Payer: Self-pay | Admitting: Nurse Practitioner
